# Patient Record
Sex: Male | Born: 1985 | Race: White | Hispanic: No | Marital: Married | Smoking: Current every day smoker
Health system: Southern US, Community
[De-identification: ages and names within clinical notes are randomized; demographics above are authoritative.]

## PROBLEM LIST (undated history)

## (undated) HISTORY — PX: HERNIA REPAIR: SHX51

---
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
## 2006-11-12 ENCOUNTER — Emergency Department (HOSPITAL_COMMUNITY): Admission: EM | Admit: 2006-11-12 | Discharge: 2006-11-12 | Payer: Self-pay | Admitting: Emergency Medicine

## 2007-10-02 ENCOUNTER — Emergency Department (HOSPITAL_COMMUNITY): Admission: EM | Admit: 2007-10-02 | Discharge: 2007-10-02 | Payer: Self-pay | Admitting: Emergency Medicine

## 2009-05-18 ENCOUNTER — Encounter: Payer: Self-pay | Admitting: Orthopedic Surgery

## 2009-05-18 ENCOUNTER — Emergency Department (HOSPITAL_COMMUNITY): Admission: EM | Admit: 2009-05-18 | Discharge: 2009-05-18 | Payer: Self-pay | Admitting: Emergency Medicine

## 2009-05-19 ENCOUNTER — Ambulatory Visit: Payer: Self-pay | Admitting: Orthopedic Surgery

## 2009-05-19 ENCOUNTER — Encounter (INDEPENDENT_AMBULATORY_CARE_PROVIDER_SITE_OTHER): Payer: Self-pay | Admitting: *Deleted

## 2009-05-19 DIAGNOSIS — S93409A Sprain of unspecified ligament of unspecified ankle, initial encounter: Secondary | ICD-10-CM | POA: Insufficient documentation

## 2009-05-19 DIAGNOSIS — S8263XA Displaced fracture of lateral malleolus of unspecified fibula, initial encounter for closed fracture: Secondary | ICD-10-CM

## 2009-05-26 ENCOUNTER — Ambulatory Visit: Payer: Self-pay | Admitting: Orthopedic Surgery

## 2009-06-16 ENCOUNTER — Ambulatory Visit: Payer: Self-pay | Admitting: Orthopedic Surgery

## 2009-10-04 ENCOUNTER — Emergency Department (HOSPITAL_COMMUNITY): Admission: EM | Admit: 2009-10-04 | Discharge: 2009-10-04 | Payer: Self-pay | Admitting: Emergency Medicine

## 2010-02-15 NOTE — Letter (Signed)
Summary: History form  History form   Imported By: Jacklynn Ganong 05/26/2009 15:36:58  _____________________________________________________________________  External Attachment:    Type:   Image     Comment:   External Document

## 2010-02-15 NOTE — Letter (Signed)
Summary: Out of Work  [facility]  [address]  [place], [place] [address]   Phone: [phone]  Fax: [phone]    June 16, 2009   Employee:  Derrick Mcdonald    To Whom It May Concern:   For Medical reasons, please excuse the above named employee from work for the following dates:  Start:   06-16-2009 He had an appointment here this morning, returning to work today  End:    If you need additional information, please feel free to contact our office.         Sincerely,    Dr. Terrance Mass.

## 2010-02-15 NOTE — Assessment & Plan Note (Signed)
Summary: 3 wk reck rt ankle after boot/bcbs/bsf   Visit Type:  Follow-up Referring Provider:  ap er Primary Provider:  na  CC:  recheck ankle.  History of Present Illness: 25 years old male injured her RIGHT ankle, May 2. He stepped off a truck. He is a clinical twisted, felt acute pain had acute swelling. Complains of 3/10 pain, which is dull, throbbing constant, associated with bruising, numbness, tingling, and swelling.  DOI MAY 2ND  Xrays [facility] right ankle on 05/18/09.x-rays show avulsion of the tip of the medial malleolus and lateral malleolus.  Meds: Norco 5 given from er, as needed.  Today is 3 week recheck rt ankle after cam walker.  Still has soreness, takes boot off to walk around house.'    Allergies: No Known Drug Allergies  Review of Systems       no giving out  Physical Exam  Skin:  intact without lesions or rashes Psych:  alert and cooperative; normal mood and affect; normal attention span and concentration   Foot/Ankle Exam  General:    Well-developed, well-nourished ,normal body habitus; no deformities, normal grooming.    Gait:    antalgic.  mild RIGHT ankle RIGHT foot RIGHT lower extremity antalgic gait  Skin:    Intact with no scars, lesions, rashes, cafe-au-lait spots or bruising.    Inspection:    swelling: RIGHT ankle mild  Palpation:    tenderness mild medial and lateral ankle joints near the malleolar  Vascular:    dorsalis pedis and posterior tibial pulses 2+ and symmetric, capillary refill < 2 seconds, normal hair pattern, no evidence of ischemia.   Sensory:    gross sensation intact bilaterally in lower extremities.    Motor:    weakness in eversion as well as inversion, dorsiflexion and plantar flexion are normal  Ankle Exam:    RIGHT ankle stability and inversion as well as anterior drawer   Impression & Recommendations:  Problem # 1:  CLOSED FRACTURE OF LATERAL MALLEOLUS (ICD-824.2) Assessment Improved  Orders: Est.  Patient Level III (04540)  Problem # 2:  ANKLE SPRAIN (ICD-845.00) Assessment: Improved  Patient Instructions: 1)  Wear ASO brace x 1 month  2)  f/u if needed

## 2010-02-15 NOTE — Assessment & Plan Note (Signed)
Summary: 1 WK RE-CK RT ANKLE?CAM WALKER/BCBS/CAF   Visit Type:  Follow-up Referring Provider:  ap er Primary Provider:  na  CC:  recheck rt ankle.  History of Present Illness: 25 years old male injured her RIGHT ankle, May 2. He stepped off a truck. He is a clinical twisted, felt acute pain had acute swelling. Complains of 3/10 pain, which is dull, throbbing constant, associated with bruising, numbness, tingling, and swelling.  Xrays [facility] right ankle on 05/18/09.x-rays show avulsion of the tip of the medial malleolus and lateral malleolus.  Meds: Norco 5 given from er, no antiinflammatory.  Is swelling has gone down his pain is improved we reviewed his x-rays he was here picture  He is placed in a short Cam Walker weight-bear as tolerated return in 3 weeks       Allergies: No Known Drug Allergies   Impression & Recommendations:  Problem # 1:  CLOSED FRACTURE OF LATERAL MALLEOLUS (ICD-824.2) Assessment Improved  Orders: Est. Patient Level II (04540)  Problem # 2:  ANKLE SPRAIN (ICD-845.00) Assessment: Improved  Orders: Est. Patient Level II (98119)  Patient Instructions: 1)  CAM WALKER WBAT  2)  RETURN IN 3 WEEKS

## 2010-02-15 NOTE — Assessment & Plan Note (Signed)
Summary: [facility] FOL/UP/RT ANKLE FX/XR [facility] 05/18/09/BCBS/CAF   Vital Signs:  Patient profile:   25 year old male Height:      72 inches Weight:      248 pounds Pulse rate:   72 / minute Resp:     16 per minute  Vitals Entered By: Fuller Canada MD (May 19, 2009 10:29 AM)  Visit Type:  new patient Referring Provider:  ap er Primary Provider:  na  CC:  right ankle pain.  History of Present Illness: 25 years old male injured her RIGHT ankle, May 2. He stepped off a truck. He is a clinical twisted, felt acute pain had acute swelling. Complains of 3/10 pain, which is dull, throbbing constant, associated with bruising, numbness, tingling, and swelling.   Xrays [facility] right ankle on 05/18/09.x-rays show avulsion of the tip of the medial malleolus and lateral malleolus.  Meds: Norco 5 given from er, does not take.      Allergies (verified): No Known Drug Allergies  Past History:  Past Medical History: na  Past Surgical History: na  Family History: FH of Cancer:  Family History of Diabetes Family History Coronary Heart Disease male < 46 Family History of Arthritis Hx, family, kidney disease NEC  Social History: Patient is married.  office work/ delivery driver smokes cigs alcohol use caffeine use daily  Review of Systems Constitutional:  Denies weight loss, weight gain, fever, chills, and fatigue. Cardiovascular:  Denies chest pain, palpitations, fainting, and murmurs. Respiratory:  Denies short of breath, wheezing, couch, tightness, pain on inspiration, and snoring . Gastrointestinal:  Complains of heartburn; denies nausea, vomiting, diarrhea, constipation, and blood in your stools. Genitourinary:  Denies frequency, urgency, difficulty urinating, painful urination, flank pain, and bleeding in urine. Neurologic:  Denies numbness, tingling, unsteady gait, dizziness, tremors, and seizure. Musculoskeletal:  Denies joint pain, swelling, instability, stiffness, redness, heat,  and muscle pain. Endocrine:  Denies excessive thirst, exessive urination, and heat or cold intolerance. Psychiatric:  Denies nervousness, depression, anxiety, and hallucinations. Skin:  Denies changes in the skin, poor healing, rash, itching, and redness. HEENT:  Denies blurred or double vision, eye pain, redness, and watering. Immunology:  Denies seasonal allergies, sinus problems, and allergic to bee stings. Hemoatologic:  Denies easy bleeding and brusing.  Physical Exam  Additional Exam:  Constitutional: vital signs see recorded values. General: normal development, nutrition, and grooming. No deformity. Body Habitus is large. CDV: Observation and palpation was normal  Lymph: palpation of the lymph nodes were normal Skin: inspection and palpation of the skin revealed no abnormalities  Neuro: coordination: normal              DTR's normal              Sensation was normal  Psyche: Alert and oriented x 3. Mood was normal.  Affect: normal  MSK: Gait: abnormal / nonweightbearing with crutches    clinical examination shows he has a significant amount of swelling mediolateral pain. Over the malleolar dorsal swelling of the foot, discoloration of the skin. Painful range of motion of 20 at the ankle joint. Pulsatile is normal.     Impression & Recommendations:  Problem # 1:  ANKLE SPRAIN (ICD-845.00)  Grade 2   Orders: New Patient Level III ([address])  Problem # 2:  CLOSED FRACTURE OF LATERAL MALLEOLUS (ICD-824.2)  avulsions medial and lateral   Orders: New Patient Level III ([address]454)  Patient Instructions: 1)  elevate the foot apply ice no weight bearing  2)  1 week re check ? go into CAM walker  3)  OOW x 1 week

## 2010-02-15 NOTE — Letter (Signed)
Summary: Out of Work  [facility]  [address]  [place], [place] [address]   Phone: [phone]  Fax: [phone]    May 19, 2009   Employee:  Derrick Mcdonald    To Whom It May Concern:   For Medical reasons, please excuse the above named employee from work for the following dates:  Start:   05/19/2009  End/Return to work:  05/26/09   If you need additional information, please feel free to contact our office.         Sincerely,    Terrance Mass, MD

## 2010-09-01 ENCOUNTER — Encounter: Payer: Self-pay | Admitting: *Deleted

## 2010-09-01 ENCOUNTER — Emergency Department (HOSPITAL_COMMUNITY)
Admission: EM | Admit: 2010-09-01 | Discharge: 2010-09-02 | Disposition: A | Discharge status: Home / Self Care | Payer: Self-pay | Attending: Emergency Medicine | Admitting: Emergency Medicine

## 2010-09-01 DIAGNOSIS — F17200 Nicotine dependence, unspecified, uncomplicated: Secondary | ICD-10-CM | POA: Insufficient documentation

## 2010-09-01 DIAGNOSIS — K047 Periapical abscess without sinus: Secondary | ICD-10-CM | POA: Insufficient documentation

## 2010-09-01 MED ORDER — PENICILLIN V POTASSIUM 250 MG PO TABS
500.0000 mg | ORAL_TABLET | Freq: Once | ORAL | Status: AC
  Administered 2010-09-01: 500 mg via ORAL
  Filled 2010-09-01: qty 2

## 2010-09-01 MED ORDER — PENICILLIN V POTASSIUM 500 MG PO TABS: 500.0000 mg | ORAL_TABLET | Freq: Four times a day (QID) | ORAL | Status: AC

## 2010-09-01 MED ORDER — OXYCODONE-ACETAMINOPHEN 5-325 MG PO TABS: 1.0000 | ORAL_TABLET | ORAL | Status: AC | PRN

## 2010-09-01 MED ORDER — OXYCODONE-ACETAMINOPHEN 5-325 MG PO TABS
1.0000 | ORAL_TABLET | Freq: Once | ORAL | Status: AC
  Administered 2010-09-01: 1 via ORAL
  Filled 2010-09-01: qty 1

## 2010-09-01 NOTE — ED Provider Notes (Signed)
History   Has seen a dentist in April and was put on antibiotics before he would pull his tooth in his right upper mouth. States he lost his insurance. Started getting painful again 2-3 weeks ago. No fever. Has swelling of his right face and gum.   CSN: [id number] Arrival date & time: 09/01/2010 10:08 PM  Chief Complaint  Patient presents with  . Dental Pain   Patient is a 25 y.o. male presenting with tooth pain. The history is provided by the patient.  Dental PainThe primary symptoms include mouth pain. Primary symptoms do not include fever, shortness of breath or sore throat. Episode onset: Pain started in April. The symptoms are worsening.  Additional symptoms include: gum swelling and gum tenderness. Additional symptoms do not include: trouble swallowing and pain with swallowing.    History reviewed. No pertinent past medical history.  History reviewed. No pertinent past surgical history.  History reviewed. No pertinent family history.  History  Substance Use Topics  . Smoking status: Current Everyday Smoker  . Smokeless tobacco: Not on file  . Alcohol Use: No      Review of Systems  Constitutional: Negative for fever.  HENT: Negative for sore throat and trouble swallowing.   Respiratory: Negative for shortness of breath.   All other systems reviewed and are negative.    Physical Exam  BP 163/83  Pulse 92  Temp(Src) 98.7 F (37.1 C) (Oral)  Resp 16  Ht 6\' 2"  (1.88 m)  Wt 250 lb (113.399 kg)  BMI 32.10 kg/m2  SpO2 99%  Physical Exam  Constitutional: He is oriented to person, place, and time. He appears well-developed and well-nourished.  HENT:  Head: Normocephalic and atraumatic.  Mouth/Throat: Oropharynx is clear and moist and mucous membranes are normal. Abnormal dentition. Dental abscesses present.         Has mild swelling of the right side of his face  Eyes: Conjunctivae and EOM are normal. Pupils are equal, round, and reactive to light.  Neck: Normal  range of motion. Neck supple.  Musculoskeletal: Normal range of motion.  Neurological: He is alert and oriented to person, place, and time.  Skin: Skin is warm and dry.  Psychiatric: He has a normal mood and affect.    ED Course  Procedures  MDM  During my exam I pressed on the swollen area and a large amount of purulent material was released.  Pt started on oral PEN VK and percocet.     Samuel Jester, MD 09/01/10 [phone]

## 2010-09-01 NOTE — ED Notes (Signed)
Dental pain  Since April

## 2011-12-10 ENCOUNTER — Encounter (HOSPITAL_COMMUNITY): Payer: Self-pay | Admitting: *Deleted

## 2011-12-10 ENCOUNTER — Emergency Department (HOSPITAL_COMMUNITY)
Admission: EM | Admit: 2011-12-10 | Discharge: 2011-12-10 | Disposition: A | Discharge status: Home / Self Care | Payer: Managed Care, Other (non HMO) | Attending: Emergency Medicine | Admitting: Emergency Medicine

## 2011-12-10 DIAGNOSIS — L237 Allergic contact dermatitis due to plants, except food: Secondary | ICD-10-CM

## 2011-12-10 DIAGNOSIS — T6591XA Toxic effect of unspecified substance, accidental (unintentional), initial encounter: Secondary | ICD-10-CM | POA: Insufficient documentation

## 2011-12-10 DIAGNOSIS — L299 Pruritus, unspecified: Secondary | ICD-10-CM | POA: Insufficient documentation

## 2011-12-10 DIAGNOSIS — F17200 Nicotine dependence, unspecified, uncomplicated: Secondary | ICD-10-CM | POA: Insufficient documentation

## 2011-12-10 DIAGNOSIS — R21 Rash and other nonspecific skin eruption: Secondary | ICD-10-CM | POA: Insufficient documentation

## 2011-12-10 DIAGNOSIS — L255 Unspecified contact dermatitis due to plants, except food: Secondary | ICD-10-CM | POA: Insufficient documentation

## 2011-12-10 DIAGNOSIS — Y92009 Unspecified place in unspecified non-institutional (private) residence as the place of occurrence of the external cause: Secondary | ICD-10-CM | POA: Insufficient documentation

## 2011-12-10 DIAGNOSIS — Y9389 Activity, other specified: Secondary | ICD-10-CM | POA: Insufficient documentation

## 2011-12-10 MED ORDER — PREDNISONE 10 MG PO TABS: ORAL_TABLET | ORAL | Status: DC

## 2011-12-10 MED ORDER — HYDROXYZINE HCL 50 MG PO TABS: ORAL_TABLET | ORAL | Status: DC

## 2011-12-10 MED ORDER — HYDROXYZINE HCL 25 MG PO TABS
50.0000 mg | ORAL_TABLET | Freq: Once | ORAL | Status: AC
  Administered 2011-12-10: 50 mg via ORAL
  Filled 2011-12-10: qty 2

## 2011-12-10 MED ORDER — PREDNISONE 50 MG PO TABS
60.0000 mg | ORAL_TABLET | Freq: Once | ORAL | Status: AC
  Administered 2011-12-10: 60 mg via ORAL
  Filled 2011-12-10: qty 1

## 2011-12-10 NOTE — ED Notes (Signed)
Poison oak to bil upper legs and bil arms x 1 wk.

## 2011-12-11 NOTE — ED Provider Notes (Signed)
Medical screening examination/treatment/procedure(s) were performed by non-physician practitioner and as supervising physician I was immediately available for consultation/collaboration.   [place] Carmack L Gorje Iyer, MD 12/11/11 2159 

## 2011-12-11 NOTE — ED Provider Notes (Signed)
History     CSN: [id number]  Arrival date & time 12/10/11  1024   First MD Initiated Contact with Patient 12/10/11 1116      Chief Complaint  Patient presents with  . 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  Since then,  He has developed scattered itchy, raised patches on his bilateral upper arms, forearms, hands on his upper thighs.  He has used plain calamine and caladryl lotion and oral benadryl without improvement in itching and spread.  There has been no drainage from the rash,  He has had no fevers or chills,  No facial swelling or shortness of breath.  The history is provided by the patient.    History reviewed. No pertinent past medical history.  Past Surgical History  Procedure Date  . Hernia repair     No family history on file.  History  Substance Use Topics  . Smoking status: Current Every Day Smoker    Types: Cigarettes  . Smokeless tobacco: Not on file  . Alcohol Use: No      Review of Systems  Constitutional: Negative for fever and chills.  HENT: Negative for facial swelling.   Respiratory: Negative for shortness of breath and wheezing.   Skin: Positive for rash.  Neurological: Negative for numbness.    Allergies  Review of patient's allergies indicates no known allergies.  Home Medications   Current Outpatient Rx  Name  Route  Sig  Dispense  Refill  . TUMS PO   Oral   Take 2 each by mouth at bedtime.           Marland Kitchen HYDROXYZINE HCL 50 MG PO TABS      Take one or 2 tablets every 6 hours as needed for itching   30 tablet   0   . IBUPROFEN 200 MG PO TABS   Oral   Take 400 mg by mouth daily as needed. For pain          . PREDNISONE 10 MG PO TABS      Take 6 tabs daily by mouth for 1 day,  Then 5 tabs  daily for 2 days,  4 tabs daily for 2 days,  3 tabs daily for 2 days,  2 tabs daily for 2 days,  Then 1 tab daily for 2 days.   36 tablet   0     BP 138/75  Pulse 90  Temp 98 F (36.7 C) (Oral)  Resp 16  Ht 6\' 2"  (1.88 m)  Wt 265 lb (120.203 kg)  BMI 34.02 kg/m2  SpO2 100%  Physical Exam  Constitutional: He appears well-developed and well-nourished. No distress.  HENT:  Head: Normocephalic.  Neck: Neck supple.  Cardiovascular: Normal rate.   Pulmonary/Chest: Effort normal. He has no wheezes.  Musculoskeletal: Normal range of motion. He exhibits no edema.  Skin: Rash noted. Rash is vesicular. There is erythema.    ED Course  Procedures (including critical care time)  Labs Reviewed - No data to display No results found.   1. Contact dermatitis due to poison oak       MDM  Pt prescribed prednisone taper, first dose given in ed.  Atarax also as trial in place of benadryl prn itching.  Recheck by pcp  if not improved over the next week.        Burgess Amor, [place] 12/11/11 2103

## 2012-04-03 ENCOUNTER — Emergency Department (HOSPITAL_COMMUNITY)
Admission: EM | Admit: 2012-04-03 | Discharge: 2012-04-03 | Disposition: A | Discharge status: Home / Self Care | Payer: Managed Care, Other (non HMO) | Attending: Emergency Medicine | Admitting: Emergency Medicine

## 2012-04-03 ENCOUNTER — Encounter (HOSPITAL_COMMUNITY): Payer: Self-pay | Admitting: *Deleted

## 2012-04-03 DIAGNOSIS — R112 Nausea with vomiting, unspecified: Secondary | ICD-10-CM | POA: Insufficient documentation

## 2012-04-03 DIAGNOSIS — F17200 Nicotine dependence, unspecified, uncomplicated: Secondary | ICD-10-CM | POA: Insufficient documentation

## 2012-04-03 DIAGNOSIS — R197 Diarrhea, unspecified: Secondary | ICD-10-CM | POA: Insufficient documentation

## 2012-04-03 DIAGNOSIS — R1013 Epigastric pain: Secondary | ICD-10-CM | POA: Insufficient documentation

## 2012-04-03 LAB — POCT I-STAT, CHEM 8
BUN: 6 mg/dL (ref 6–23)
Calcium, Ion: 1.21 mmol/L (ref 1.12–1.23)
Creatinine, Ser: 0.8 mg/dL (ref 0.50–1.35)
Glucose, Bld: 95 mg/dL (ref 70–99)
Hemoglobin: 16 g/dL (ref 13.0–17.0)
TCO2: 28 mmol/L (ref 0–100)

## 2012-04-03 MED ORDER — ONDANSETRON 8 MG PO TBDP
8.0000 mg | ORAL_TABLET | Freq: Once | ORAL | Status: AC
  Administered 2012-04-03: 8 mg via ORAL
  Filled 2012-04-03: qty 1

## 2012-04-03 MED ORDER — LOPERAMIDE HCL 2 MG PO CAPS
4.0000 mg | ORAL_CAPSULE | ORAL | Status: DC | PRN
  Administered 2012-04-03: 4 mg via ORAL
  Filled 2012-04-03: qty 2

## 2012-04-03 MED ORDER — PROMETHAZINE HCL 25 MG PO TABS: 25.0000 mg | ORAL_TABLET | Freq: Three times a day (TID) | ORAL | Status: DC | PRN

## 2012-04-03 MED ORDER — ONDANSETRON 8 MG PO TBDP: 8.0000 mg | ORAL_TABLET | Freq: Three times a day (TID) | ORAL | Status: DC | PRN

## 2012-04-03 NOTE — ED Notes (Signed)
Nauseated today, had vomiting and diarrhea yesterday

## 2012-04-03 NOTE — ED Provider Notes (Signed)
History  This chart was scribed for Ward Givens, MD by Bennett Scrape, ED Scribe. This patient was seen in room [facility] and the patient's care was started at 3:37 PM.  CSN: [id number]  Arrival date & time 04/03/12  1522   First MD Initiated Contact with Patient 04/03/12 1537      Chief Complaint  Patient presents with  . Nausea     The history is provided by the patient. No language interpreter was used.    Derrick Mcdonald is a 27 y.o. male who presents to the Emergency Department complaining of gradual onset, gradually worsening, constant nausea with associated several episodes of emesis, several episodes of diarrhea and epigastric abdominal pain that started yesterday after eating dinner. He reports that the last episode of emesis occurred this morning and last episode of diarrhea was 2 hours ago. He states that his wife was sick with similar symptoms at home  2 days before but got better after vomiting once. He reports drinking water and decreasing his food intake today with improvement. He denies any dizziness or lightheadedness,  numbness and fever as associated symptoms. He does feel alittle weak. He does not have a h/o chronic medical conditions and denies being on any daily medications. He is a "vapor" cigarette smoker, started 3 months ago, but denies alcohol use.  No currently PCP   History reviewed. No pertinent past medical history.  Past Surgical History  Procedure Laterality Date  . Hernia repair      No family history on file.  History  Substance Use Topics  . Smoking status: Current Every Day Smoker    Types: Cigarettes  . Smokeless tobacco: Not on file  . Alcohol Use: No  Pt works third shift Lives at home  Lives with spouse  Review of Systems  Constitutional: Negative for fever.  Gastrointestinal: Positive for nausea, vomiting, abdominal pain and diarrhea. Negative for blood in stool.  Neurological: Negative for dizziness, weakness, light-headedness  and numbness.  All other systems reviewed and are negative.    Allergies  Review of patient's allergies indicates no known allergies.  Home Medications  No current outpatient prescriptions on file.  Triage Vitals: BP 153/76  Pulse 79  Temp(Src) 97.9 F (36.6 C) (Oral)  Resp 18  Ht 6\' 2"  (1.88 m)  Wt 257 lb (116.574 kg)  BMI 32.98 kg/m2  SpO2 100%  Vital signs normal    Physical Exam  Nursing note and vitals reviewed. Constitutional: He is oriented to person, place, and time. He appears well-developed and well-nourished.  Non-toxic appearance. He does not appear ill. No distress.  HENT:  Head: Normocephalic and atraumatic.  Right Ear: External ear normal.  Left Ear: External ear normal.  Nose: Nose normal. No mucosal edema or rhinorrhea.  Mouth/Throat: Oropharynx is clear and moist and mucous membranes are normal. No dental abscesses or edematous.  Eyes: Conjunctivae and EOM are normal. Pupils are equal, round, and reactive to light.  Neck: Normal range of motion and full passive range of motion without pain. Neck supple.  Cardiovascular: Normal rate, regular rhythm and normal heart sounds.  Exam reveals no gallop and no friction rub.   No murmur heard. Pulmonary/Chest: Effort normal and breath sounds normal. No respiratory distress. He has no wheezes. He has no rhonchi. He has no rales. He exhibits no tenderness and no crepitus.  Abdominal: Soft. Normal appearance and bowel sounds are normal. He exhibits no distension. There is no tenderness. There is no rebound  and no guarding.  Musculoskeletal: Normal range of motion. He exhibits no edema and no tenderness.  Moves all extremities well.   Neurological: He is alert and oriented to person, place, and time. He has normal strength. No cranial nerve deficit.  Skin: Skin is warm, dry and intact. No rash noted. No erythema. No pallor.  Psychiatric: He has a normal mood and affect. His speech is normal and behavior is normal. His  mood appears not anxious.    ED Course  Procedures (including critical care time)  Medications  loperamide (IMODIUM) capsule 4 mg (4 mg Oral Given 04/03/12 1605)  ondansetron (ZOFRAN-ODT) disintegrating tablet 8 mg (8 mg Oral Given 04/03/12 1605)    DIAGNOSTIC STUDIES: Oxygen Saturation is 100% on room air, normal by my interpretation.    COORDINATION OF CARE: 3:53 PM-Discussed treatment plan which includes medications and I-stat with pt at bedside and pt agreed to plan.   4:53 PM-Pt rechecked and feels improved with medications listed above. Informed pt of lab work results. Pt asked me to evaluate a ganglion cyst on the left wrist. He denies pain. Advised pt to f/u with an orthopedist if he develops pain. Discussed discharge plan which includes staying hydrated with pt and pt agreed to plan. Will provide a work note.   Results for orders placed during the hospital encounter of 04/03/12  POCT I-STAT, CHEM 8      Result Value Range   Sodium 142  135 - 145 mEq/L   Potassium 4.3  3.5 - 5.1 mEq/L   Chloride 106  96 - 112 mEq/L   BUN 6  6 - 23 mg/dL   Creatinine, Ser 4.78  0.50 - 1.35 mg/dL   Glucose, Bld 95  70 - 99 mg/dL   Calcium, Ion 2.95  6.21 - 1.23 mmol/L   TCO2 28  0 - 100 mmol/L   Hemoglobin 16.0  13.0 - 17.0 g/dL   HCT 30.8  65.7 - 84.6 %   Laboratory interpretation all normal   1. Nausea vomiting and diarrhea      Discharge Medication List as of 04/03/2012  4:58 PM    START taking these medications   Details  ondansetron (ZOFRAN ODT) 8 MG disintegrating tablet Take 1 tablet (8 mg total) by mouth every 8 (eight) hours as needed for nausea., Starting 04/03/2012, Until Discontinued, Print    promethazine (PHENERGAN) 25 MG tablet Take 1 tablet (25 mg total) by mouth every 8 (eight) hours as needed for nausea (may make you sleepy)., Starting 04/03/2012, Until Discontinued, Print         Devoria Albe, MD, FACEP   MDM     I personally performed the services  described in this documentation, which was scribed in my presence. The recorded information has been reviewed and considered.  Devoria Albe, MD, Derrick Mcdonald       Ward Givens, MD 04/03/12 [id number]

## 2012-05-25 ENCOUNTER — Emergency Department (HOSPITAL_COMMUNITY)
Admission: EM | Admit: 2012-05-25 | Discharge: 2012-05-25 | Disposition: A | Discharge status: Home / Self Care | Payer: Managed Care, Other (non HMO) | Attending: Emergency Medicine | Admitting: Emergency Medicine

## 2012-05-25 ENCOUNTER — Encounter (HOSPITAL_COMMUNITY): Payer: Self-pay | Admitting: *Deleted

## 2012-05-25 DIAGNOSIS — Z79899 Other long term (current) drug therapy: Secondary | ICD-10-CM | POA: Insufficient documentation

## 2012-05-25 DIAGNOSIS — F17200 Nicotine dependence, unspecified, uncomplicated: Secondary | ICD-10-CM | POA: Insufficient documentation

## 2012-05-25 DIAGNOSIS — L509 Urticaria, unspecified: Secondary | ICD-10-CM | POA: Insufficient documentation

## 2012-05-25 MED ORDER — DIPHENHYDRAMINE HCL 25 MG PO CAPS
25.0000 mg | ORAL_CAPSULE | Freq: Once | ORAL | Status: AC
  Administered 2012-05-25: 25 mg via ORAL
  Filled 2012-05-25: qty 1

## 2012-05-25 MED ORDER — FAMOTIDINE 20 MG PO TABS: 20.0000 mg | ORAL_TABLET | Freq: Two times a day (BID) | ORAL | Status: DC

## 2012-05-25 MED ORDER — PREDNISONE 50 MG PO TABS
60.0000 mg | ORAL_TABLET | Freq: Once | ORAL | Status: AC
  Administered 2012-05-25: 60 mg via ORAL
  Filled 2012-05-25: qty 1

## 2012-05-25 MED ORDER — DIPHENHYDRAMINE HCL 25 MG PO TABS: 50.0000 mg | ORAL_TABLET | Freq: Four times a day (QID) | ORAL | Status: DC

## 2012-05-25 MED ORDER — PREDNISONE 20 MG PO TABS: 60.0000 mg | ORAL_TABLET | Freq: Every day | ORAL | Status: DC

## 2012-05-25 MED ORDER — FAMOTIDINE 20 MG PO TABS
20.0000 mg | ORAL_TABLET | Freq: Once | ORAL | Status: AC
  Administered 2012-05-25: 20 mg via ORAL
  Filled 2012-05-25: qty 1

## 2012-05-25 NOTE — ED Notes (Signed)
nad noted prior to dc. Dc instructions reviewed and explained. 3 scripts given to pt and voiced understanding. Ambulated out without difficulty.

## 2012-05-25 NOTE — Discharge Instructions (Signed)
Hives Hives are itchy, red, swollen areas of the skin. They can vary in size and location on your body. Hives can come and go for hours or several days (acute hives) or for several weeks (chronic hives). Hives do not spread from person to person (noncontagious). They may get worse with scratching, exercise, and emotional stress. CAUSES   Allergic reaction to food, additives, or drugs.  Infections, including the common cold.  Illness, such as vasculitis, lupus, or thyroid disease.  Exposure to sunlight, heat, or cold.  Exercise.  Stress.  Contact with chemicals. SYMPTOMS   Red or white swollen patches on the skin. The patches may change size, shape, and location quickly and repeatedly.  Itching.  Swelling of the hands, feet, and face. This may occur if hives develop deeper in the skin. DIAGNOSIS  Your caregiver can usually tell what is wrong by performing a physical exam. Skin or blood tests may also be done to determine the cause of your hives. In some cases, the cause cannot be determined. TREATMENT  Mild cases usually get better with medicines such as antihistamines. Severe cases may require an emergency epinephrine injection. If the cause of your hives is known, treatment includes avoiding that trigger.  HOME CARE INSTRUCTIONS   Avoid causes that trigger your hives.  Take antihistamines as directed by your caregiver to reduce the severity of your hives. Non-sedating or low-sedating antihistamines are usually recommended. Do not drive while taking an antihistamine.  Take any other medicines prescribed for itching as directed by your caregiver.  Wear loose-fitting clothing.  Keep all follow-up appointments as directed by your caregiver. SEEK MEDICAL CARE IF:   You have persistent or severe itching that is not relieved with medicine.  You have painful or swollen joints. SEEK IMMEDIATE MEDICAL CARE IF:   You have a fever.  Your tongue or lips are swollen.  You have  trouble breathing or swallowing.  You feel tightness in the throat or chest.  You have abdominal pain. These problems may be the first sign of a life-threatening allergic reaction. Call your local emergency services (911 in [place] MAKE SURE YOU:   Understand these instructions.  Will watch your condition.  Will get help right away if you are not doing well or get worse. Document Released: 01/02/2005 Document Revised: 07/04/2011 Document Reviewed: 03/28/2011 [facility] Patient Information 2013 [place], [place].   Take your next dose of prednisone tomorrow evening.  I recommend taking Benadryl and Pepcid for at least the next 2 days, after which you may stop these 2 medicines if your rash completely gone.  However, you need to take the entire course of prednisone until gone, for a total of 5 days including today's dose.  Return here for any problems such as listed above.

## 2012-05-25 NOTE — ED Notes (Signed)
First noticed rash 2 hours ago to body

## 2012-05-25 NOTE — ED Provider Notes (Signed)
History     CSN: [id number]  Arrival date & time 05/25/12  4098   First MD Initiated Contact with Patient 05/25/12 1840      Chief Complaint  Patient presents with  . Rash    (Consider location/radiation/quality/duration/timing/severity/associated sxs/prior treatment) Patient is a 27 y.o. male presenting with rash. The history is provided by the spouse and the patient.  Rash Location:  Ano-genital, torso and leg Torso rash location:  Abd RUQ and abd RLQ Ano-genital rash location:  Pelvis, L hip, R hip and groin Leg rash location:  L ankle and R ankle Quality: itchiness and redness   Quality: not blistering, not draining, not painful and not weeping   Severity:  Moderate Onset quality:  Sudden Duration:  2 hours Timing:  Constant Progression:  Waxing and waning Chronicity:  New Context: new detergent/soap   Context: not chemical exposure, not exposure to similar rash, not insect bite/sting, not medications and not nuts   Context comment:  He had used a new underarm deodorant but he switched to his old brand 3 days ago. Relieved by:  Antihistamines Worsened by:  Nothing tried Ineffective treatments:  None tried Associated symptoms: no fever, no hoarse voice, no nausea, no shortness of breath, no sore throat, no throat swelling, no tongue swelling and not wheezing     History reviewed. No pertinent past medical history.  Past Surgical History  Procedure Laterality Date  . Hernia repair      No family history on file.  History  Substance Use Topics  . Smoking status: Current Every Day Smoker    Types: Cigarettes  . Smokeless tobacco: Not on file  . Alcohol Use: No      Review of Systems  Constitutional: Negative for fever and chills.  HENT: Negative for sore throat, hoarse voice and facial swelling.   Respiratory: Negative for shortness of breath, wheezing and stridor.   Gastrointestinal: Negative for nausea.  Skin: Positive for rash.  Neurological: Negative  for numbness.    Allergies  Review of patient's allergies indicates no known allergies.  Home Medications   Current Outpatient Rx  Name  Route  Sig  Dispense  Refill  . diphenhydrAMINE (BENADRYL) 25 mg capsule   Oral   Take 50 mg by mouth every 6 (six) hours as needed for itching.         . diphenhydrAMINE (BENADRYL) 25 MG tablet   Oral   Take 2 tablets (50 mg total) by mouth every 6 (six) hours.   16 tablet   0   . famotidine (PEPCID) 20 MG tablet   Oral   Take 1 tablet (20 mg total) by mouth 2 (two) times daily.   6 tablet   0   . predniSONE (DELTASONE) 20 MG tablet   Oral   Take 3 tablets (60 mg total) by mouth daily.   12 tablet   0     BP 149/74  Pulse 87  Temp(Src) 98.9 F (37.2 C) (Oral)  Resp 20  SpO2 99%  Physical Exam  Constitutional: He appears well-developed and well-nourished. No distress.  HENT:  Head: Normocephalic.  Neck: Neck supple.  Cardiovascular: Normal rate.   Pulmonary/Chest: Effort normal. He has no wheezes.  Musculoskeletal: Normal range of motion. He exhibits no edema.  Skin: Rash noted. Rash is urticarial.    ED Course  Procedures (including critical care time)  Labs Reviewed - No data to display No results found.   1. Hives  Patient was given 25 mg of Benadryl as he had taken 25 mg prior to arrival, Pepcid 20 mg and prednisone 60 mg by mouth.  MDM  Classic hives of unclear etiology.  His only new exposure was 2 deodorant but hadn't used this new product in 3 days.  He was prescribed additional prednisone for a five-day pulse dose of 60 mg, also encouraged Pepcid twice a day along with Benadryl 4 times daily for the next several days.  Advised to return here for any worsened symptoms which were discussed.        Burgess Amor, PA-C 05/25/12 1931

## 2012-05-25 NOTE — ED Provider Notes (Signed)
Medical screening examination/treatment/procedure(s) were performed by non-physician practitioner and as supervising physician I was immediately available for consultation/collaboration.   Alexy Heldt J. Olimpia Tinch, MD 05/25/12 1932 

## 2012-07-30 ENCOUNTER — Encounter (HOSPITAL_COMMUNITY): Payer: Self-pay

## 2012-07-30 ENCOUNTER — Emergency Department (HOSPITAL_COMMUNITY)
Admission: EM | Admit: 2012-07-30 | Discharge: 2012-07-30 | Disposition: A | Discharge status: Home / Self Care | Payer: Managed Care, Other (non HMO) | Attending: Emergency Medicine | Admitting: Emergency Medicine

## 2012-07-30 ENCOUNTER — Emergency Department (HOSPITAL_COMMUNITY): Payer: Managed Care, Other (non HMO)

## 2012-07-30 DIAGNOSIS — M549 Dorsalgia, unspecified: Secondary | ICD-10-CM

## 2012-07-30 DIAGNOSIS — IMO0002 Reserved for concepts with insufficient information to code with codable children: Secondary | ICD-10-CM | POA: Insufficient documentation

## 2012-07-30 DIAGNOSIS — F17200 Nicotine dependence, unspecified, uncomplicated: Secondary | ICD-10-CM | POA: Insufficient documentation

## 2012-07-30 DIAGNOSIS — Y9389 Activity, other specified: Secondary | ICD-10-CM | POA: Insufficient documentation

## 2012-07-30 DIAGNOSIS — Z79899 Other long term (current) drug therapy: Secondary | ICD-10-CM | POA: Insufficient documentation

## 2012-07-30 DIAGNOSIS — X503XXA Overexertion from repetitive movements, initial encounter: Secondary | ICD-10-CM | POA: Insufficient documentation

## 2012-07-30 DIAGNOSIS — Y929 Unspecified place or not applicable: Secondary | ICD-10-CM | POA: Insufficient documentation

## 2012-07-30 MED ORDER — OXYCODONE-ACETAMINOPHEN 5-325 MG PO TABS
2.0000 | ORAL_TABLET | Freq: Once | ORAL | Status: AC
  Administered 2012-07-30: 2 via ORAL
  Filled 2012-07-30: qty 2

## 2012-07-30 MED ORDER — PREDNISONE 10 MG PO TABS: ORAL_TABLET | ORAL | Status: DC

## 2012-07-30 MED ORDER — OXYCODONE-ACETAMINOPHEN 5-325 MG PO TABS: 1.0000 | ORAL_TABLET | ORAL | Status: DC | PRN

## 2012-07-30 MED ORDER — CYCLOBENZAPRINE HCL 10 MG PO TABS: 10.0000 mg | ORAL_TABLET | Freq: Three times a day (TID) | ORAL | Status: DC | PRN

## 2012-07-30 NOTE — ED Notes (Signed)
Pt presents with lower back pain that began last night and has worsened today. Pt denies trauma or injury.

## 2012-07-30 NOTE — ED Notes (Signed)
Pt c/o pain in r lower back off and on for several months.  Denies injury.

## 2012-07-30 NOTE — ED Provider Notes (Signed)
History    CSN: [id number] Arrival date & time 07/30/12  1405  First MD Initiated Contact with Patient 07/30/12 1430     Chief Complaint  Patient presents with  . Back Pain   (Consider location/radiation/quality/duration/timing/severity/associated sxs/prior Treatment) Patient is a 27 y.o. male presenting with back pain. The history is provided by the patient and the spouse.  Back Pain Location:  Lumbar spine Quality:  Aching Radiates to:  R posterior upper leg and R thigh Pain severity:  Moderate Pain is:  Same all the time Onset quality:  Gradual Duration:  2 months Timing:  Constant Progression:  Worsening Chronicity:  Chronic Context: lifting heavy objects and twisting   Context: not recent illness and not recent injury   Relieved by:  Bed rest and being still Worsened by:  Ambulation, bending, standing, twisting and movement Ineffective treatments:  NSAIDs Associated symptoms: leg pain   Associated symptoms: no abdominal pain, no abdominal swelling, no bladder incontinence, no bowel incontinence, no chest pain, no dysuria, no fever, no headaches, no numbness, no paresthesias, no pelvic pain, no perianal numbness, no tingling and no weakness    History reviewed. No pertinent past medical history. Past Surgical History  Procedure Laterality Date  . Hernia repair     No family history on file. History  Substance Use Topics  . Smoking status: Current Every Day Smoker    Types: Cigarettes  . Smokeless tobacco: Not on file  . Alcohol Use: No    Review of Systems  Constitutional: Negative for fever.  Respiratory: Negative for shortness of breath.   Cardiovascular: Negative for chest pain.  Gastrointestinal: Negative for nausea, vomiting, abdominal pain, diarrhea, constipation and bowel incontinence.  Genitourinary: Negative for bladder incontinence, dysuria, hematuria, flank pain, decreased urine volume, difficulty urinating and pelvic pain.       No perineal  numbness or incontinence of urine or feces  Musculoskeletal: Positive for back pain. Negative for joint swelling.  Skin: Negative for rash.  Neurological: Negative for tingling, weakness, numbness, headaches and paresthesias.  All other systems reviewed and are negative.    Allergies  Review of patient's allergies indicates no known allergies.  Home Medications   Current Outpatient Rx  Name  Route  Sig  Dispense  Refill  . diphenhydrAMINE (BENADRYL) 25 mg capsule   Oral   Take 50 mg by mouth every 6 (six) hours as needed for itching.         . diphenhydrAMINE (BENADRYL) 25 MG tablet   Oral   Take 2 tablets (50 mg total) by mouth every 6 (six) hours.   16 tablet   0   . famotidine (PEPCID) 20 MG tablet   Oral   Take 1 tablet (20 mg total) by mouth 2 (two) times daily.   6 tablet   0   . predniSONE (DELTASONE) 20 MG tablet   Oral   Take 3 tablets (60 mg total) by mouth daily.   12 tablet   0    BP 150/94  Pulse 82  Temp(Src) 98.4 F (36.9 C)  Resp 18  Ht 6\' 2"  (1.88 m)  Wt 244 lb (110.678 kg)  BMI 31.31 kg/m2  SpO2 99% Physical Exam  Nursing note and vitals reviewed. Constitutional: He is oriented to person, place, and time. He appears well-developed and well-nourished. No distress.  HENT:  Head: Normocephalic and atraumatic.  Neck: Normal range of motion. Neck supple.  Cardiovascular: Normal rate, regular rhythm, normal heart sounds and intact  distal pulses.   No murmur heard. Pulmonary/Chest: Effort normal and breath sounds normal. No respiratory distress.  Musculoskeletal: He exhibits tenderness. He exhibits no edema.       Lumbar back: He exhibits tenderness and pain. He exhibits normal range of motion, no swelling, no deformity, no laceration and normal pulse.  ttp of the right lumbar paraspinal muscles and SI joiint.  No spinal tenderness.  DP pulses are brisk and symmetrical.  Distal sensation intact.  Hip Flexors/Extensors are intact  Neurological:  He is alert and oriented to person, place, and time. No cranial nerve deficit or sensory deficit. He exhibits normal muscle tone. Coordination and gait normal.  Reflex Scores:      Patellar reflexes are 2+ on the right side and 2+ on the left side.      Achilles reflexes are 2+ on the right side and 2+ on the left side. Skin: Skin is warm and dry.    ED Course  Procedures (including critical care time) Labs Reviewed - No data to display  Dg Lumbar Spine Complete  07/30/2012   *RADIOLOGY REPORT*  Clinical Data: Low back pain. Injured a few months ago.  LUMBAR SPINE - COMPLETE 4+ VIEW  Comparison: None.  Findings: Mild disc space narrowing L4-5 and L5-S1.  Slight retrolisthesis L5 on S1 of 1-2 mm is facet mediated.  No definite pars defects.  Lower lumbar facet arthropathy.   No worrisome osseous findings.  IMPRESSION: Mild degenerative change as described.   Original Report Authenticated By: Davonna Belling, M.D.     MDM    Patient reviewed the [facility] narcotics database. No recent narcotic prescriptions since January of this year.  Patient has ttp of the right  lumbar paraspinal muscles and SI joint.  No focal neuro deficits on exam.  Ambulates with a steady gait.   Doubt emergent neurological or infectious process.  Referral info given.  Pt agrees to rest, ice and close f/u .  Pain likely lumbar radiculopathy  Dylann Gallier L. Trisha Mangle, PA-C 08/01/12 1247

## 2012-08-03 NOTE — ED Provider Notes (Signed)
Medical screening examination/treatment/procedure(s) were performed by non-physician practitioner and as supervising physician I was immediately available for consultation/collaboration.   Joya Gaskins, MD 08/03/12 [phone]

## 2012-10-15 ENCOUNTER — Encounter: Payer: Self-pay | Admitting: Orthopedic Surgery

## 2012-10-15 ENCOUNTER — Ambulatory Visit (INDEPENDENT_AMBULATORY_CARE_PROVIDER_SITE_OTHER): Payer: Managed Care, Other (non HMO) | Admitting: Orthopedic Surgery

## 2012-10-15 VITALS — BP 127/86 | Ht 74.0 in | Wt 247.0 lb

## 2012-10-15 DIAGNOSIS — M48061 Spinal stenosis, lumbar region without neurogenic claudication: Secondary | ICD-10-CM

## 2012-10-15 MED ORDER — DICLOFENAC SODIUM 75 MG PO TBEC: 75.0000 mg | DELAYED_RELEASE_TABLET | Freq: Two times a day (BID) | ORAL | Status: DC

## 2012-10-15 NOTE — Progress Notes (Signed)
  Subjective:    Patient ID: Derrick Mcdonald, male    DOB: Dec 14, 1985, 27 y.o.   MRN: [id number]  Chief Complaint  Patient presents with  . Back Pain    Pain in lower back d/t injury. Referred by Bobbye Riggs    Back Pain   27 year old male who had an MRI in 2006 4 back pain and right leg pain presents with persistent back pain and occasional radiating pain in his right posterior thigh. Previous treatment includes Percocet, physical therapy for 6 weeks steroids IM and oral without any relief. It is also unclear whether or not he did have a trial of anti-inflammatories.  Review of systems    Review of Systems  Musculoskeletal: Positive for back pain.   joint pain stiffness muscle pain heartburn the other 12 systems were normal     Objective:   Physical Exam  Nursing note and vitals reviewed. Constitutional: He is oriented to person, place, and time. He appears well-developed and well-nourished. No distress.  HENT:  Head: Normocephalic.  Neck: Neck supple. No tracheal deviation present.  Cardiovascular: Normal rate.   Pulses:      Dorsalis pedis pulses are 2+ on the right side, and 2+ on the left side.       Posterior tibial pulses are 2+ on the right side, and 2+ on the left side.  Musculoskeletal: Normal range of motion.       Cervical back: Normal. He exhibits no tenderness.       Thoracic back: Normal. He exhibits no tenderness.       Lumbar back: He exhibits tenderness and bony tenderness. He exhibits no swelling, no edema, no deformity and no spasm.  Neurological: He is alert and oriented to person, place, and time. He has normal strength and normal reflexes. He displays normal reflexes. No sensory deficit. He exhibits normal muscle tone. Coordination normal.  Normal straight leg raises bilaterally. Normal muscle tone and strength bilaterally in both legs. Normal range of motion both hips  Skin: Skin is warm. No rash noted. He is diaphoretic. No erythema. No pallor.   Psychiatric: He has a normal mood and affect. His behavior is normal. Judgment and thought content normal.          Assessment & Plan:  Plain radiographs taken in July show degenerative disc disease  The patient has had a previous MRI back in 2006 which was done on the mobile unit. We do not have access to that. He says he was told he had 2 ruptured discs and spinal stenosis  Based on his failure of nonoperative treatment pain for greater than a year recommend repeat MRI in preparation for epidural steroids and/or referral to neurosurgery  I'll call him with these results is placed on anti-inflammatories to see if this will help his arthritis pain. We do not recommend narcotics.

## 2012-10-15 NOTE — Patient Instructions (Addendum)
MRI lumbar spine  Call patient with results  Start anti-inflammatories

## 2012-10-16 ENCOUNTER — Telehealth: Payer: Self-pay | Admitting: *Deleted

## 2012-10-16 NOTE — Telephone Encounter (Signed)
MRI lumbar spine CPT code 16109. No pre-cert required per AVR system. Patient scheduled for 10/18/12 at 7:45 am. Patient follow up with Dr. Romeo Apple 11/05/12 at 10:45 am

## 2012-10-18 ENCOUNTER — Ambulatory Visit (HOSPITAL_COMMUNITY): Payer: Managed Care, Other (non HMO)

## 2012-10-22 ENCOUNTER — Encounter (HOSPITAL_COMMUNITY): Payer: Self-pay

## 2012-10-22 ENCOUNTER — Other Ambulatory Visit: Payer: Self-pay | Admitting: Orthopedic Surgery

## 2012-10-22 ENCOUNTER — Ambulatory Visit (HOSPITAL_COMMUNITY)
Admission: RE | Admit: 2012-10-22 | Discharge: 2012-10-22 | Disposition: A | Discharge status: Home / Self Care | Payer: Managed Care, Other (non HMO) | Source: Ambulatory Visit | Attending: Orthopedic Surgery | Admitting: Orthopedic Surgery

## 2012-10-22 DIAGNOSIS — M5106 Intervertebral disc disorders with myelopathy, lumbar region: Secondary | ICD-10-CM | POA: Insufficient documentation

## 2012-10-22 DIAGNOSIS — M48061 Spinal stenosis, lumbar region without neurogenic claudication: Secondary | ICD-10-CM

## 2012-10-22 DIAGNOSIS — M545 Low back pain, unspecified: Secondary | ICD-10-CM | POA: Insufficient documentation

## 2012-11-05 ENCOUNTER — Ambulatory Visit: Payer: Managed Care, Other (non HMO) | Admitting: Orthopedic Surgery

## 2012-11-05 ENCOUNTER — Telehealth: Payer: Self-pay | Admitting: Orthopedic Surgery

## 2012-11-05 NOTE — Telephone Encounter (Signed)
Patient informed of MRI results, and is aware that a referral will be made to [facility].

## 2012-11-05 NOTE — Telephone Encounter (Signed)
Derrick Mcdonald cancelled his 11/05/12 appointment for MRI results due to car trouble.  He asked if you can call him with the results. His  Home # [phone] or cell # [phone]

## 2012-11-05 NOTE — Telephone Encounter (Signed)
He has spinal stenosis and we will refer him to neurosurgery

## 2012-11-06 ENCOUNTER — Telehealth: Payer: Self-pay | Admitting: *Deleted

## 2012-11-06 ENCOUNTER — Other Ambulatory Visit: Payer: Self-pay | Admitting: *Deleted

## 2012-11-06 DIAGNOSIS — M48061 Spinal stenosis, lumbar region without neurogenic claudication: Secondary | ICD-10-CM

## 2012-11-06 NOTE — Telephone Encounter (Signed)
Office notes and referral  faxed to [facility]. Awaiting appointment 

## 2012-12-16 NOTE — Telephone Encounter (Signed)
Received fax from [facility], and they have been unable to reach patient to schedule an appointment. I tried two different numbers and got the answering machine on both. I left a message for him to call the office.

## 2013-04-21 ENCOUNTER — Encounter (HOSPITAL_COMMUNITY): Payer: Self-pay | Admitting: Emergency Medicine

## 2013-04-21 ENCOUNTER — Emergency Department (HOSPITAL_COMMUNITY)
Admission: EM | Admit: 2013-04-21 | Discharge: 2013-04-21 | Disposition: A | Discharge status: Home / Self Care | Payer: Medicaid Other | Attending: Emergency Medicine | Admitting: Emergency Medicine

## 2013-04-21 DIAGNOSIS — K047 Periapical abscess without sinus: Secondary | ICD-10-CM

## 2013-04-21 DIAGNOSIS — F17200 Nicotine dependence, unspecified, uncomplicated: Secondary | ICD-10-CM | POA: Insufficient documentation

## 2013-04-21 DIAGNOSIS — Z792 Long term (current) use of antibiotics: Secondary | ICD-10-CM | POA: Insufficient documentation

## 2013-04-21 DIAGNOSIS — R51 Headache: Secondary | ICD-10-CM | POA: Insufficient documentation

## 2013-04-21 MED ORDER — OXYCODONE-ACETAMINOPHEN 5-325 MG PO TABS: 1.0000 | ORAL_TABLET | ORAL | Status: DC | PRN

## 2013-04-21 MED ORDER — CLINDAMYCIN HCL 150 MG PO CAPS: 150.0000 mg | ORAL_CAPSULE | Freq: Four times a day (QID) | ORAL | Status: DC

## 2013-04-21 NOTE — ED Provider Notes (Signed)
CSN: [id number]     Arrival date & time 04/21/13  1243 History  This chart was scribed for non-physician practitioner Burgess AmorJulie Amoree Newlon, PA-C working with Glynn OctaveStephen Rancour, MD by Dorothey Basemania Sutton, ED Scribe. This patient was seen in room APFT23/APFT23 and the patient's care was started at 3:01 PM.    Chief Complaint  Patient presents with  . Dental Pain   The history is provided by the patient. No language interpreter was used.   HPI Comments: Derrick Mcdonald is a 28 y.o. male who presents to the Emergency Department complaining of a constant pain to the right, lower dentition with associated right-sided facial swelling and a mild, diffuse headache onset 5 days ago that has been progressively worsening. He states that he followed up with a dentist and received an x-ray that indicated an infection in the area, so he was started on a course of amoxicillin, currently on day 5 but with worsened pain and swelling over the past several days.   Patient states that he has an appointment with the dentist to have the tooth extracted in 4 days, but could not wait to be seen because the pain became too severe. He reports taking Tylenol at home with a minimal amount of temporary relief. He denies fever, nausea, weakness, dizziness. Patient has no allergies to medications. Patient has no other pertinent medical history.   History reviewed. No pertinent past medical history. Past Surgical History  Procedure Laterality Date  . Hernia repair     History reviewed. No pertinent family history. History  Substance Use Topics  . Smoking status: Current Every Day Smoker -- 0.75 packs/day    Types: Cigarettes  . Smokeless tobacco: Not on file  . Alcohol Use: No    Review of Systems  Constitutional: Negative for fever.  HENT: Positive for dental problem and facial swelling. Negative for sore throat.   Respiratory: Negative for shortness of breath.   Gastrointestinal: Negative for nausea.  Musculoskeletal: Negative for neck  pain and neck stiffness.  Neurological: Positive for headaches. Negative for dizziness and weakness.      Allergies  Review of patient's allergies indicates no known allergies.  Home Medications   Current Outpatient Rx  Name  Route  Sig  Dispense  Refill  . acetaminophen (TYLENOL) 500 MG tablet   Oral   Take 1,500 mg by mouth 2 (two) times daily as needed for moderate pain.         Marland Kitchen. amoxicillin (AMOXIL) 500 MG capsule   Oral   Take 500 mg by mouth 3 (three) times daily.         . clindamycin (CLEOCIN) 150 MG capsule   Oral   Take 1 capsule (150 mg total) by mouth every 6 (six) hours.   28 capsule   0   . oxyCODONE-acetaminophen (PERCOCET/ROXICET) 5-325 MG per tablet   Oral   Take 1 tablet by mouth every 4 (four) hours as needed for severe pain.   20 tablet   0    Triage Vitals: BP 154/76  Pulse 91  Temp(Src) 98.7 F (37.1 C) (Oral)  Resp 14  Wt 255 lb (115.667 kg)  SpO2 99%  Physical Exam  Constitutional: He is oriented to person, place, and time. He appears well-developed and well-nourished. No distress.  HENT:  Head: Normocephalic and atraumatic.  Right Ear: Tympanic membrane and external ear normal.  Left Ear: Tympanic membrane and external ear normal.  Mouth/Throat: Oropharynx is clear and moist and mucous membranes are  normal. No oral lesions. No trismus in the jaw. Abnormal dentition. Dental abscesses present.  Fluctuant abscess along the right, inferior molars. No spontaneous drainage. Some decay of the 2nd, right, lower molar. Right submandibular adenopathy.   Eyes: Conjunctivae are normal.  Neck: Normal range of motion. Neck supple.  Cardiovascular: Normal rate and normal heart sounds.   Pulmonary/Chest: Effort normal.  Abdominal: He exhibits no distension.  Musculoskeletal: Normal range of motion.  Lymphadenopathy:       Head (right side): Submandibular adenopathy present.    He has no cervical adenopathy.  Neurological: He is alert and  oriented to person, place, and time.  Skin: Skin is warm and dry. No erythema.  Psychiatric: He has a normal mood and affect.    ED Course  Procedures (including critical care time)  DIAGNOSTIC STUDIES: Oxygen Saturation is 99% on room air, normal by my interpretation.    COORDINATION OF CARE: 3:07 PM- Will perform an incision and drainage of the abscess. Discussed treatment plan with patient at bedside and patient verbalized agreement.   3:54 PM- Attempted to drain the abscess. Will switch patient's antibiotic and discharge him with a short course of pain medication. Discussed treatment plan with patient at bedside and patient verbalized agreement.    INCISION AND DRAINAGE PROCEDURE NOTE: Patient identification was confirmed and verbal consent was obtained. This procedure was performed by Burgess Amor, PA-C at 3:45 PM. Site: right, lower gingiva  Sterile procedures observed Needle size: dental syringe Anesthetic used (type and amt): 0.5% marcaine, 0.6 mL Blade size: 11 Drainage: scant amount of purulent drainage with blood Complexity: simple Dental block, incision made over site, wound drained and explored loculations,  Pt tolerated procedure well without complications.  Instructions for care discussed verbally and pt provided with additional written instructions for homecare and f/u.   Labs Review Labs Reviewed - No data to display Imaging Review No results found.   EKG Interpretation None      MDM   Final diagnoses:  Dental abscess    Pt was switched to clindamycin from amoxil, since infection worsened while on amoxil.  He was prescribed oxycodone, cautioned regarding sedation and advised yogurt consumption.  Planned f/u with dentist in 4 days.  I feel there was still more retained pus in this infection pocket,  Patient deferred further drainage and flushing beyond the initial small gingival incision.    I personally performed the services described in this  documentation, which was scribed in my presence. The recorded information has been reviewed and is accurate.   Burgess Amor, PA-C 04/21/13 2127

## 2013-04-21 NOTE — Care Management Note (Signed)
ED/CM noted patient did not have health insurance and/or PCP listed in the computer.  Patient was given the [place] resource handout with information on the clinics, food pantries, and the handout for new health insurance sign-up.  Patient expressed appreciation for information received. 

## 2013-04-21 NOTE — Discharge Instructions (Signed)
Abscessed Tooth An abscessed tooth is an infection around your tooth. It may be caused by holes or damage to the tooth (cavity) or a dental disease. An abscessed tooth causes mild to very bad pain in and around the tooth. See your dentist right away if you have tooth or gum pain. HOME CARE  Take your medicine as told. Finish it even if you start to feel better.  Do not drive after taking pain medicine.  Rinse your mouth (gargle) often with salt water ( teaspoon salt in 8 ounces of warm water).  Do not apply heat to the outside of your face. GET HELP RIGHT AWAY IF:   You have a temperature by mouth above 102 F (38.9 C), not controlled by medicine.  You have chills and a very bad headache.  You have problems breathing or swallowing.  Your mouth will not open.  You develop puffiness (swelling) on the neck or around the eye.  Your pain is not helped by medicine.  Your pain is getting worse instead of better. MAKE SURE YOU:   Understand these instructions.  Will watch your condition.  Will get help right away if you are not doing well or get worse. Document Released: 06/21/2007 Document Revised: 03/27/2011 Document Reviewed: 04/12/2010 [facility] Patient Information 2014 [employer] [place].   Complete your entire course of antibiotics as prescribed.  You  may use the oxycodone for pain relief but do not drive within 4 hours of taking as this will make you drowsy.  Avoid applying heat or ice to this abscess area which can worsen your symptoms.  You may use warm salt water swish and spit treatment or half peroxide and water swish and spit after meals to keep this area clean as discussed.  Keep your appointment with your dentist as planned.

## 2013-04-21 NOTE — ED Notes (Signed)
Pt with dental pain to right bottom for 2 weeks but got worse Friday, states [facility]

## 2013-04-21 NOTE — ED Notes (Signed)
Pt arrives with c/o dental pain in right lower molar, states he has had pain since last Thursday and pain has gotten progressively worse. States he has an appointment to see dentist later this week but pain has become worse.

## 2013-04-22 NOTE — ED Provider Notes (Signed)
Medical screening examination/treatment/procedure(s) were performed by non-physician practitioner and as supervising physician I was immediately available for consultation/collaboration.   EKG Interpretation None       Ruhee Enck, MD 04/22/13 0924 

## 2013-06-26 ENCOUNTER — Emergency Department (HOSPITAL_COMMUNITY): Payer: Medicaid Other

## 2013-06-26 ENCOUNTER — Emergency Department (HOSPITAL_COMMUNITY)
Admission: EM | Admit: 2013-06-26 | Discharge: 2013-06-27 | Disposition: A | Discharge status: Home / Self Care | Payer: Medicaid Other | Attending: Emergency Medicine | Admitting: Emergency Medicine

## 2013-06-26 ENCOUNTER — Encounter (HOSPITAL_COMMUNITY): Payer: Self-pay | Admitting: Emergency Medicine

## 2013-06-26 DIAGNOSIS — IMO0002 Reserved for concepts with insufficient information to code with codable children: Secondary | ICD-10-CM | POA: Insufficient documentation

## 2013-06-26 DIAGNOSIS — Z79899 Other long term (current) drug therapy: Secondary | ICD-10-CM | POA: Insufficient documentation

## 2013-06-26 DIAGNOSIS — S60229A Contusion of unspecified hand, initial encounter: Secondary | ICD-10-CM | POA: Insufficient documentation

## 2013-06-26 DIAGNOSIS — S60222A Contusion of left hand, initial encounter: Secondary | ICD-10-CM

## 2013-06-26 DIAGNOSIS — Y9389 Activity, other specified: Secondary | ICD-10-CM | POA: Insufficient documentation

## 2013-06-26 DIAGNOSIS — Y929 Unspecified place or not applicable: Secondary | ICD-10-CM | POA: Insufficient documentation

## 2013-06-26 DIAGNOSIS — F17200 Nicotine dependence, unspecified, uncomplicated: Secondary | ICD-10-CM | POA: Insufficient documentation

## 2013-06-26 MED ORDER — HYDROCODONE-ACETAMINOPHEN 5-325 MG PO TABS
1.0000 | ORAL_TABLET | Freq: Once | ORAL | Status: AC
  Administered 2013-06-26: 1 via ORAL
  Filled 2013-06-26: qty 1

## 2013-06-26 NOTE — ED Notes (Signed)
Patient states he hit his left hand with a sledgehammer yesterday and c/o increased pain and now a knot has come up on hand.  Patient able to move all fingers.

## 2013-06-26 NOTE — ED Provider Notes (Signed)
CSN: [id number]     Arrival date & time 06/26/13  2304 History   First MD Initiated Contact with Patient 06/26/13 2315     Chief Complaint  Patient presents with  . Hand Injury     (Consider location/radiation/quality/duration/timing/severity/associated sxs/prior Treatment) Patient is a 28 y.o. male presenting with hand injury. The history is provided by the patient.  Hand Injury Location:  Hand Time since incident:  1 day Injury: yes   Mechanism of injury comment:  Direct blow Hand location:  L hand Pain details:    Quality:  Throbbing   Radiates to:  Does not radiate   Severity:  Moderate   Onset quality:  Sudden   Timing:  Constant   Progression:  Worsening Chronicity:  New Handedness:  Right-handed Dislocation: no   Foreign body present:  No foreign bodies Prior injury to area:  No Relieved by:  Nothing Worsened by:  Movement  Derrick Mcdonald is a 28 y.o. male who presents to the ED with left hand pain that started after he hit his hand with a sledgehammer yesterday. He was trying to hit a pin that went in atail gait and he slipped and hit his hand. He complains of increased pain and swelling today and has a knot that has come up on his hand. No breaks in the skin. History reviewed. No pertinent past medical history. Past Surgical History  Procedure Laterality Date  . Hernia repair     No family history on file. History  Substance Use Topics  . Smoking status: Current Every Day Smoker -- 0.75 packs/day    Types: Cigarettes  . Smokeless tobacco: Not on file  . Alcohol Use: No    Review of Systems Negative except as stated in HPI   Allergies  Review of patient's allergies indicates no known allergies.  Home Medications   Prior to Admission medications   Medication Sig Start Date End Date Taking? Authorizing Provider  acetaminophen (TYLENOL) 500 MG tablet Take 1,500 mg by mouth 2 (two) times daily as needed for moderate pain.    Historical Provider, MD    amoxicillin (AMOXIL) 500 MG capsule Take 500 mg by mouth 3 (three) times daily.    Historical Provider, MD  clindamycin (CLEOCIN) 150 MG capsule Take 1 capsule (150 mg total) by mouth every 6 (six) hours. 04/21/13   Burgess Amor, PA-C  oxyCODONE-acetaminophen (PERCOCET/ROXICET) 5-325 MG per tablet Take 1 tablet by mouth every 4 (four) hours as needed for severe pain. 04/21/13   Burgess Amor, PA-C  Physical Exam  Nursing note and vitals reviewed. Constitutional: He is oriented to person, place, and time. He appears well-developed and well-nourished.  Eyes: Conjunctivae and EOM are normal.  Neck: Neck supple.  Cardiovascular: Normal rate.   Pulmonary/Chest: Effort normal.  Abdominal: There is no tenderness.  Musculoskeletal:       Left hand: He exhibits tenderness and swelling. He exhibits normal range of motion, normal capillary refill, no deformity and no laceration. Normal sensation noted. Normal strength noted.       Hands: Swelling and ecchymosis to the dorsum of the left hand.   Neurological: He is alert and oriented to person, place, and time. No cranial nerve deficit.  Skin: Skin is warm and dry.  Psychiatric: He has a normal mood and affect. His behavior is normal.   BP 136/86  Pulse 87  Temp(Src) 98 F (36.7 C)  Resp 18  Ht 6\' 2"  (1.88 m)  Wt 260 lb (117.935  kg)  BMI 33.37 kg/m2  SpO2 99%  ED Course  Procedures Dg Hand Complete Left  06/26/2013   CLINICAL DATA:  Left hand pain and swelling of the index finger after smashed with a sledgehammer yesterday.  EXAM: LEFT HAND - COMPLETE 3+ VIEW  COMPARISON:  None.  FINDINGS: There is no evidence of fracture or dislocation. There is no evidence of arthropathy or other focal bone abnormality. Soft tissues are unremarkable.  IMPRESSION: Negative.   Electronically Signed   By: Burman NievesWilliam  Stevens M.D.   On: 06/26/2013 23:48    MDM  28 y.o. male with pain, swelling and ecchymosis to the left hand s/p injury. Ace wrap applied, ice, elevation and  pain management. He will return for worsening symptoms. I have reviewed this patient's vital signs, nurses notes, appropriate imaging and discussed findings and plan of care with the patient. He voices understanding and agrees with plan.      Medication List    TAKE these medications       HYDROcodone-acetaminophen 5-325 MG per tablet  Commonly known as:  NORCO/VICODIN  Take 1 tablet by mouth every 4 (four) hours as needed.     ibuprofen 800 MG tablet  Commonly known as:  ADVIL,MOTRIN  Take 1 tablet (800 mg total) by mouth 3 (three) times daily.      ASK your doctor about these medications       acetaminophen 500 MG tablet  Commonly known as:  TYLENOL  Take 1,500 mg by mouth 2 (two) times daily as needed for moderate pain.     amoxicillin 500 MG capsule  Commonly known as:  AMOXIL  Take 500 mg by mouth 3 (three) times daily.     clindamycin 150 MG capsule  Commonly known as:  CLEOCIN  Take 1 capsule (150 mg total) by mouth every 6 (six) hours.     oxyCODONE-acetaminophen 5-325 MG per tablet  Commonly known as:  PERCOCET/ROXICET  Take 1 tablet by mouth every 4 (four) hours as needed for severe pain.            [facility] Orlene OchM Hani Campusano, [place]NP 06/27/13 [id number]

## 2013-06-27 MED ORDER — HYDROCODONE-ACETAMINOPHEN 5-325 MG PO TABS: 1.0000 | ORAL_TABLET | ORAL | Status: DC | PRN

## 2013-06-27 MED ORDER — IBUPROFEN 800 MG PO TABS: 800.0000 mg | ORAL_TABLET | Freq: Three times a day (TID) | ORAL | Status: DC

## 2013-06-27 NOTE — ED Provider Notes (Signed)
Medical screening examination/treatment/procedure(s) were performed by non-physician practitioner and as supervising physician I was immediately available for consultation/collaboration.   EKG Interpretation None       Tamesha Ellerbrock, MD 06/27/13 0419 

## 2013-06-27 NOTE — Discharge Instructions (Signed)
Hand Contusion °A hand contusion is a deep bruise on your hand area. Contusions are the result of an injury that caused bleeding under the skin. The contusion may turn blue, purple, or yellow. Minor injuries will give you a painless contusion, but more severe contusions may stay painful and swollen for a few weeks. °CAUSES  °A contusion is usually caused by a blow, trauma, or direct force to an area of the body. °SYMPTOMS  °· Swelling and redness of the injured area. °· Discoloration of the injured area. °· Tenderness and soreness of the injured area. °· Pain. °DIAGNOSIS  °The diagnosis can be made by taking a history and performing a physical exam. An X-ray, CT scan, or MRI may be needed to determine if there were any associated injuries, such as broken bones (fractures). °TREATMENT  °Often, the best treatment for a hand contusion is resting, elevating, icing, and applying cold compresses to the injured area. Over-the-counter medicines may also be recommended for pain control. °HOME CARE INSTRUCTIONS  °· Put ice on the injured area. °· Put ice in a plastic bag. °· Place a towel between your skin and the bag. °· Leave the ice on for 15-20 minutes, 03-04 times a day. °· Only take over-the-counter or prescription medicines as directed by your caregiver. Your caregiver may recommend avoiding anti-inflammatory medicines (aspirin, ibuprofen, and naproxen) for 48 hours because these medicines may increase bruising. °· If told, use an elastic wrap as directed. This can help reduce swelling. You may remove the wrap for sleeping, showering, and bathing. If your fingers become numb, cold, or blue, take the wrap off and reapply it more loosely. °· Elevate your hand with pillows to reduce swelling. °· Avoid overusing your hand if it is painful. °SEEK IMMEDIATE MEDICAL CARE IF:  °· You have increased redness, swelling, or pain in your hand. °· Your swelling or pain is not relieved with medicines. °· You have loss of feeling in  your hand or are unable to move your fingers. °· Your hand turns cold or blue. °· You have pain when you move your fingers. °· Your hand becomes warm to the touch. °· Your contusion does not improve in 2 days. °MAKE SURE YOU:  °· Understand these instructions. °· Will watch your condition. °· Will get help right away if you are not doing well or get worse. °Document Released: 06/24/2001 Document Revised: 09/27/2011 Document Reviewed: 06/26/2011 °[employer]® Patient Information ©2014 [employer]. ° °

## 2013-12-16 ENCOUNTER — Emergency Department (HOSPITAL_COMMUNITY)
Admission: EM | Admit: 2013-12-16 | Discharge: 2013-12-16 | Disposition: A | Discharge status: Home / Self Care | Payer: Medicaid Other | Attending: Emergency Medicine | Admitting: Emergency Medicine

## 2013-12-16 ENCOUNTER — Encounter (HOSPITAL_COMMUNITY): Payer: Self-pay | Admitting: Emergency Medicine

## 2013-12-16 DIAGNOSIS — K0381 Cracked tooth: Secondary | ICD-10-CM | POA: Insufficient documentation

## 2013-12-16 DIAGNOSIS — Z79899 Other long term (current) drug therapy: Secondary | ICD-10-CM | POA: Diagnosis not present

## 2013-12-16 DIAGNOSIS — K088 Other specified disorders of teeth and supporting structures: Secondary | ICD-10-CM | POA: Insufficient documentation

## 2013-12-16 DIAGNOSIS — Z720 Tobacco use: Secondary | ICD-10-CM | POA: Diagnosis not present

## 2013-12-16 DIAGNOSIS — Z792 Long term (current) use of antibiotics: Secondary | ICD-10-CM | POA: Diagnosis not present

## 2013-12-16 DIAGNOSIS — K0889 Other specified disorders of teeth and supporting structures: Secondary | ICD-10-CM

## 2013-12-16 MED ORDER — PENICILLIN V POTASSIUM 250 MG PO TABS: 250.0000 mg | ORAL_TABLET | Freq: Four times a day (QID) | ORAL | Status: AC

## 2013-12-16 MED ORDER — TRAMADOL HCL 50 MG PO TABS: 50.0000 mg | ORAL_TABLET | Freq: Four times a day (QID) | ORAL | Status: DC | PRN

## 2013-12-16 NOTE — ED Notes (Signed)
Pt reports dental pain x2 days. Pt reports taking otc medication with no relief. Pt reports has a broken tooth but dentist does not have an available appointments. nad noted. Airway patent.

## 2013-12-16 NOTE — ED Provider Notes (Signed)
CSN: [id number]     Arrival date & time 12/16/13  1022 History   First MD Initiated Contact with Patient 12/16/13 1108     Chief Complaint  Patient presents with  . Dental Pain     (Consider location/radiation/quality/duration/timing/severity/associated sxs/prior Treatment) HPI Comments: Pt c/o right lower dental paint times 2 days. States that he thinks is looks a little swollen.no fever or trouble swallowing or breathing. States that he dentist isn't open today. Has tried otc medication without relief. He chipped a tooth almost 1 year ago  The history is provided by the patient. No language interpreter was used.    History reviewed. No pertinent past medical history. Past Surgical History  Procedure Laterality Date  . Hernia repair     History reviewed. No pertinent family history. History  Substance Use Topics  . Smoking status: Current Every Day Smoker -- 0.75 packs/day    Types: Cigarettes  . Smokeless tobacco: Not on file  . Alcohol Use: No    Review of Systems  Constitutional: Negative.   HENT: Positive for dental problem.   Respiratory: Negative.       Allergies  Review of patient's allergies indicates no known allergies.  Home Medications   Prior to Admission medications   Medication Sig Start Date End Date Taking? Authorizing Provider  acetaminophen (TYLENOL) 500 MG tablet Take 1,500 mg by mouth 2 (two) times daily as needed for moderate pain.    Historical Provider, MD  amoxicillin (AMOXIL) 500 MG capsule Take 500 mg by mouth 3 (three) times daily.    Historical Provider, MD  clindamycin (CLEOCIN) 150 MG capsule Take 1 capsule (150 mg total) by mouth every 6 (six) hours. 04/21/13   Burgess AmorJulie Idol, PA-C  HYDROcodone-acetaminophen (NORCO/VICODIN) 5-325 MG per tablet Take 1 tablet by mouth every 4 (four) hours as needed. 06/27/13   Hope Orlene OchM Neese, NP  ibuprofen (ADVIL,MOTRIN) 800 MG tablet Take 1 tablet (800 mg total) by mouth 3 (three) times daily. 06/27/13   Hope Orlene OchM  Neese, NP  oxyCODONE-acetaminophen (PERCOCET/ROXICET) 5-325 MG per tablet Take 1 tablet by mouth every 4 (four) hours as needed for severe pain. 04/21/13   Burgess AmorJulie Idol, PA-C  penicillin v potassium (VEETID) 250 MG tablet Take 1 tablet (250 mg total) by mouth 4 (four) times daily. 12/16/13 12/23/13  Teressa LowerVrinda Dellia Donnelly, NP  traMADol (ULTRAM) 50 MG tablet Take 1 tablet (50 mg total) by mouth every 6 (six) hours as needed. 12/16/13   Teressa LowerVrinda Amery Vandenbos, NP   BP 159/74 mmHg  Pulse 73  Temp(Src) 98 F (36.7 C) (Oral)  Resp 18  Ht 6\' 2"  (1.88 m)  Wt 260 lb (117.935 kg)  BMI 33.37 kg/m2  SpO2 100% Physical Exam  Constitutional: He appears well-developed and well-nourished.  HENT:  Right Ear: External ear normal.  Left Ear: External ear normal.  Mouth/Throat:    Cracked decayed tooth. No facial swelling noted  Cardiovascular: Normal rate and regular rhythm.   Pulmonary/Chest: Effort normal and breath sounds normal.  Nursing note and vitals reviewed.   ED Course  Procedures (including critical care time) Labs Review Labs Reviewed - No data to display  Imaging Review No results found.   EKG Interpretation None      MDM   Final diagnoses:  Toothache    Treated for possible infection with pcn and hydrocodone. Discussed importance of dental follow up    Teressa LowerVrinda Grover Robinson, NP 12/16/13 1127  Benny LennertJoseph L Zammit, MD 12/16/13 [phone]

## 2013-12-16 NOTE — Discharge Instructions (Signed)

## 2014-03-23 ENCOUNTER — Emergency Department (HOSPITAL_COMMUNITY)
Admission: EM | Admit: 2014-03-23 | Discharge: 2014-03-23 | Disposition: A | Discharge status: Home / Self Care | Payer: PRIVATE HEALTH INSURANCE | Attending: Emergency Medicine | Admitting: Emergency Medicine

## 2014-03-23 ENCOUNTER — Encounter (HOSPITAL_COMMUNITY): Payer: Self-pay | Admitting: Emergency Medicine

## 2014-03-23 DIAGNOSIS — Z79899 Other long term (current) drug therapy: Secondary | ICD-10-CM | POA: Insufficient documentation

## 2014-03-23 DIAGNOSIS — Z720 Tobacco use: Secondary | ICD-10-CM | POA: Diagnosis not present

## 2014-03-23 DIAGNOSIS — K047 Periapical abscess without sinus: Secondary | ICD-10-CM | POA: Insufficient documentation

## 2014-03-23 DIAGNOSIS — K088 Other specified disorders of teeth and supporting structures: Secondary | ICD-10-CM | POA: Diagnosis present

## 2014-03-23 DIAGNOSIS — K029 Dental caries, unspecified: Secondary | ICD-10-CM | POA: Diagnosis not present

## 2014-03-23 DIAGNOSIS — K0381 Cracked tooth: Secondary | ICD-10-CM | POA: Insufficient documentation

## 2014-03-23 DIAGNOSIS — Z792 Long term (current) use of antibiotics: Secondary | ICD-10-CM | POA: Insufficient documentation

## 2014-03-23 DIAGNOSIS — K006 Disturbances in tooth eruption: Secondary | ICD-10-CM | POA: Insufficient documentation

## 2014-03-23 MED ORDER — AMOXICILLIN 500 MG PO CAPS: 500.0000 mg | ORAL_CAPSULE | Freq: Three times a day (TID) | ORAL | Status: DC

## 2014-03-23 MED ORDER — TRAMADOL HCL 50 MG PO TABS: 50.0000 mg | ORAL_TABLET | Freq: Four times a day (QID) | ORAL | Status: DC | PRN

## 2014-03-23 NOTE — Discharge Instructions (Signed)
Dental Abscess A dental abscess is a collection of infected fluid (pus) from a bacterial infection in the inner part of the tooth (pulp). It usually occurs at the end of the tooth's root.  CAUSES   Severe tooth decay.  Trauma to the tooth that allows bacteria to enter into the pulp, such as a broken or chipped tooth. SYMPTOMS   Severe pain in and around the infected tooth.  Swelling and redness around the abscessed tooth or in the mouth or face.  Tenderness.  Pus drainage.  Bad breath.  Bitter taste in the mouth.  Difficulty swallowing.  Difficulty opening the mouth.  Nausea.  Vomiting.  Chills.  Swollen neck glands. DIAGNOSIS   A medical and dental history will be taken.  An examination will be performed by tapping on the abscessed tooth.  X-rays may be taken of the tooth to identify the abscess. TREATMENT The goal of treatment is to eliminate the infection. You may be prescribed antibiotic medicine to stop the infection from spreading. A root canal may be performed to save the tooth. If the tooth cannot be saved, it may be pulled (extracted) and the abscess may be drained.  HOME CARE INSTRUCTIONS  Only take over-the-counter or prescription medicines for pain, fever, or discomfort as directed by your caregiver.  Rinse your mouth (gargle) often with salt water ( tsp salt in 8 oz [250 ml] of warm water) to relieve pain or swelling.  Do not drive after taking pain medicine (narcotics).  Do not apply heat to the outside of your face.  Return to your dentist for further treatment as directed. SEEK MEDICAL CARE IF:  Your pain is not helped by medicine.  Your pain is getting worse instead of better. SEEK IMMEDIATE MEDICAL CARE IF:  You have a fever or persistent symptoms for more than 2-3 days.  You have a fever and your symptoms suddenly get worse.  You have chills or a very bad headache.  You have problems breathing or swallowing.  You have trouble  opening your mouth.  You have swelling in the neck or around the eye. Document Released: 01/02/2005 Document Revised: 09/27/2011 Document Reviewed: 04/12/2010 [facility] Patient Information 2015 [place], [place]. This information is not intended to replace advice given to you by your health care provider. Make sure you discuss any questions you have with your health care provider.   Complete your entire course of antibiotics as prescribed.  You  may use the tramadol for pain relief but do not drive within 4 hours of taking as this will make you drowsy.  Avoid applying heat or ice to this abscess area which can worsen your symptoms.  You may use warm salt water swish and spit treatment or half peroxide and water swish and spit after meals to keep this area clean.

## 2014-03-23 NOTE — ED Provider Notes (Signed)
CSN: [id number]     Arrival date & time 03/23/14  4098 History   First MD Initiated Contact with Patient 03/23/14 [phone]     Chief Complaint  Patient presents with  . Dental Pain     (Consider location/radiation/quality/duration/timing/severity/associated sxs/prior Treatment) Patient is a 29 y.o. male presenting with tooth pain. The history is provided by the patient.  Dental Pain Location:  Upper Upper teeth location:  12/LU 1st bicuspid and 11/LU cuspid Quality:  Throbbing Severity:  Moderate Onset quality:  Gradual Duration:  3 days Timing:  Constant Progression:  Unchanged Chronicity:  Recurrent Context: abscess, dental caries and dental fracture   Prior workup: is scheduled to see new dentist on March 25.  Advised to get abx prior to appt in anticipation of probable extractions at that visit. Relieved by:  NSAIDs (motrin helps some) Worsened by:  Nothing tried Ineffective treatments:  None tried Associated symptoms: gum swelling   Associated symptoms: no difficulty swallowing, no facial pain, no facial swelling, no fever, no neck pain, no neck swelling, no oral lesions and no trismus     History reviewed. No pertinent past medical history. Past Surgical History  Procedure Laterality Date  . Hernia repair     History reviewed. No pertinent family history. History  Substance Use Topics  . Smoking status: Current Every Day Smoker -- 0.75 packs/day for 15 years    Types: Cigarettes  . Smokeless tobacco: Not on file  . Alcohol Use: No    Review of Systems  Constitutional: Negative for fever.  HENT: Positive for dental problem. Negative for facial swelling, mouth sores and sore throat.   Respiratory: Negative for shortness of breath.   Musculoskeletal: Negative for neck pain and neck stiffness.      Allergies  Review of patient's allergies indicates no known allergies.  Home Medications   Prior to Admission medications   Medication Sig Start Date End Date  Taking? Authorizing Provider  acetaminophen (TYLENOL) 500 MG tablet Take 1,500 mg by mouth 2 (two) times daily as needed for moderate pain.    Historical Provider, MD  amoxicillin (AMOXIL) 500 MG capsule Take 1 capsule (500 mg total) by mouth 3 (three) times daily. 03/23/14   Burgess Amor, PA-C  clindamycin (CLEOCIN) 150 MG capsule Take 1 capsule (150 mg total) by mouth every 6 (six) hours. 04/21/13   Burgess Amor, PA-C  HYDROcodone-acetaminophen (NORCO/VICODIN) 5-325 MG per tablet Take 1 tablet by mouth every 4 (four) hours as needed. 06/27/13   Hope Orlene Och, NP  ibuprofen (ADVIL,MOTRIN) 800 MG tablet Take 1 tablet (800 mg total) by mouth 3 (three) times daily. 06/27/13   Hope Orlene Och, NP  oxyCODONE-acetaminophen (PERCOCET/ROXICET) 5-325 MG per tablet Take 1 tablet by mouth every 4 (four) hours as needed for severe pain. 04/21/13   Burgess Amor, PA-C  traMADol (ULTRAM) 50 MG tablet Take 1 tablet (50 mg total) by mouth every 6 (six) hours as needed. 03/23/14   Burgess Amor, PA-C   BP 150/88 mmHg  Pulse 101  Temp(Src) 98.2 F (36.8 C)  Resp 16  Ht  (1.88 m)  Wt 261 lb (118.389 kg)  BMI 33.50 kg/m2  SpO2 100% Physical Exam  Constitutional: He is oriented to person, place, and time. He appears well-developed and well-nourished. No distress.  HENT:  Head: Normocephalic and atraumatic.  Right Ear: Tympanic membrane and external ear normal.  Left Ear: Tympanic membrane and external ear normal.  Mouth/Throat: Oropharynx is clear and moist and mucous  membranes are normal. No oral lesions. No trismus in the jaw. Abnormal dentition. Dental abscesses and dental caries present.    Generalized dental decay, several old extractions.  Eyes: Conjunctivae are normal.  Neck: Normal range of motion. Neck supple.  Cardiovascular: Normal rate and normal heart sounds.   Pulmonary/Chest: Effort normal.  Abdominal: He exhibits no distension.  Musculoskeletal: Normal range of motion.  Lymphadenopathy:    He has no  cervical adenopathy.  Neurological: He is alert and oriented to person, place, and time.  Skin: Skin is warm and dry. No erythema.  Psychiatric: He has a normal mood and affect.    ED Course  Procedures (including critical care time) Labs Review Labs Reviewed - No data to display  Imaging Review No results found.   EKG Interpretation None      MDM   Final diagnoses:  Dental abscess   Amoxil, tramadol.  F/u with new dentist as planned.  The patient appears reasonably screened and/or stabilized for discharge and I doubt any other medical condition or other [facility] requiring further screening, evaluation, or treatment in the ED at this time prior to discharge.    Burgess AmorJulie Friend Dorfman, PA-C 03/23/14 [id number]  Benjiman CoreNathan Pickering, MD 03/23/14 [phone]

## 2014-03-23 NOTE — ED Notes (Signed)
Oral pain began Friday. Pt reports swelling and pain around specific tooth in upper jaw, front area. Pt reports that he has a dentist appointment but they are not able to see him until the  25th.

## 2014-05-31 ENCOUNTER — Emergency Department (HOSPITAL_COMMUNITY)
Admission: EM | Admit: 2014-05-31 | Discharge: 2014-05-31 | Disposition: A | Discharge status: Home / Self Care | Payer: Managed Care, Other (non HMO) | Attending: Emergency Medicine | Admitting: Emergency Medicine

## 2014-05-31 ENCOUNTER — Encounter (HOSPITAL_COMMUNITY): Payer: Self-pay | Admitting: Emergency Medicine

## 2014-05-31 DIAGNOSIS — K029 Dental caries, unspecified: Secondary | ICD-10-CM | POA: Insufficient documentation

## 2014-05-31 DIAGNOSIS — Z791 Long term (current) use of non-steroidal anti-inflammatories (NSAID): Secondary | ICD-10-CM | POA: Diagnosis not present

## 2014-05-31 DIAGNOSIS — Z792 Long term (current) use of antibiotics: Secondary | ICD-10-CM | POA: Insufficient documentation

## 2014-05-31 DIAGNOSIS — K088 Other specified disorders of teeth and supporting structures: Secondary | ICD-10-CM | POA: Insufficient documentation

## 2014-05-31 DIAGNOSIS — Z720 Tobacco use: Secondary | ICD-10-CM | POA: Insufficient documentation

## 2014-05-31 DIAGNOSIS — K0889 Other specified disorders of teeth and supporting structures: Secondary | ICD-10-CM

## 2014-05-31 MED ORDER — PENICILLIN V POTASSIUM 500 MG PO TABS: 500.0000 mg | ORAL_TABLET | Freq: Four times a day (QID) | ORAL | Status: AC

## 2014-05-31 NOTE — Discharge Instructions (Signed)
Dental Pain °Toothache is pain in or around a tooth. It may get worse with chewing or with cold or heat.  °HOME CARE °· Your dentist may use a numbing medicine during treatment. If so, you may need to avoid eating until the medicine wears off. Ask your dentist about this. °· Only take medicine as told by your dentist or doctor. °· Avoid chewing food near the painful tooth until after all treatment is done. Ask your dentist about this. °GET HELP RIGHT AWAY IF:  °· The problem gets worse or new problems appear. °· You have a fever. °· There is redness and puffiness (swelling) of the face, jaw, or neck. °· You cannot open your mouth. °· There is pain in the jaw. °· There is very bad pain that is not helped by medicine. °MAKE SURE YOU:  °· Understand these instructions. °· Will watch your condition. °· Will get help right away if you are not doing well or get worse. °Document Released: 06/21/2007 Document Revised: 03/27/2011 Document Reviewed: 06/21/2007 °ExitCare® Patient Information ©2015 [employer]. This information is not intended to replace advice given to you by your health care provider. Make sure you discuss any questions you have with your health care provider. ° ° ° °Emergency Department Resource Guide °1) Find a Doctor and Pay Out of Pocket °Although you won't have to find out who is covered by your insurance plan, it is a good idea to ask around and get recommendations. You will then need to call the office and see if the doctor you have chosen will accept you as a new patient and what types of options they offer for patients who are self-pay. Some doctors offer discounts or will set up payment plans for their patients who do not have insurance, but you will need to ask so you aren't surprised when you get to your appointment. ° °2) Contact Your Local Health Department °Not all health departments have doctors that can see patients for sick visits, but many do, so it is worth a call to see if yours does.  If you don't know where your local health department is, you can check in your phone book. The CDC also has a tool to help you locate your state's health department, and many state websites also have listings of all of their local health departments. ° °3) Find a Walk-in Clinic °If your illness is not likely to be very severe or complicated, you may want to try a walk in clinic. These are popping up all over the country in pharmacies, drugstores, and shopping centers. They're usually staffed by nurse practitioners or physician assistants that have been trained to treat common illnesses and complaints. They're usually fairly quick and inexpensive. However, if you have serious medical issues or chronic medical problems, these are probably not your best option. ° °No Primary Care Doctor: °- Call [facility] at  832-8000 - they can help you locate a primary care doctor that  accepts your insurance, provides certain services, etc. °- Physician Referral Service- 1-800-533-3463 ° °Chronic Pain Problems: °Organization         Address  Phone   Notes  °[facility]  (336) 297-2271 Patients need to be referred by their primary care doctor.  ° °Medication Assistance: °Organization         Address  Phone   Notes  °[place] [facility] [address] °[place], [place] [address] (336) 641-8030 --Must be a resident   of [place] °-- Must have NO insurance coverage whatsoever (no Medicaid/ Medicare, etc.) °-- The pt. MUST have a primary care doctor that directs their care regularly and follows them in the community °  °[facility]  (866) 331-1348   °[employer]  (888) 892-1162   ° °Agencies that provide inexpensive medical care: °Organization         Address  Phone   Notes  °[facility]  (336) 832-8035   °[facility]    (336) 832-7272   °[facility] [address] °[place], [place] [address] (336) 832-4777   °[facility] [address], °[place] (336) 271-4999   °[facility]    (336) 373-0678   °[facility]    (336) 272-1050   °[facility] ° [address], [place] Phone:  (336) 832-4444, Fax:  (336) 832-4440 Hours of Operation:  9 am - 6 pm, M-F.  Also accepts Medicaid/Medicare and self-pay.  °[facility] ° [address], [place] Phone: (336) 832-3150, Fax: (336) 832-3151. Hours of Operation:  8:30 am - 5:30 pm, M-F.  Also accepts Medicaid and self-pay.  °[facility] [place] [address], [place] Phone: (336) 878-6027   °[facility] [address], [place], [place] (336)723-1848, Ext. 123 Mondays & Thursdays: 7-9 AM.  First 15 patients are seen on a first come, first serve basis. °  ° °Medicaid-accepting [place] Providers: ° °Organization         Address  Phone   Notes  °[facility] [address], [place] (336) 641-2100 Also accepts self-pay patients.  °[facility] [address], [place] ° (336) 856-9996   °[facility] [address], [place] (336) 288-8857   °[facility] [address] [place] [address], [place] (336) 299-7000   °Veita Bland [address], [place]  ° (336) 373-1557 Only accepts [place] Access Medicaid patients after they have their name applied to their card.  ° °Self-Pay (no insurance) in [place]: ° °Organization         Address  Phone   Notes  °Sickle Cell Patients, [facility] [address], [place] (336) 832-1970   °[facility] [address], [place] (336) 832-4400   ° [facility] ° [address], [place] (336) 992-4800   °[facility]/Dr. Osei-Bonsu ° [address] [place] [address], [place] or [address], [place] (336) 841-8500  Phone number for both [place] and [place] locations is the same.  °[facility] [address], [place] (336) 299-0000   °[facility] [address] [place] [address], [place] or [address] (336) 852-7530 °(336) 878-2260   °[facility] [address],  (336) 350-1642, phone; (336) 294-5005, fax Sees patients 1st and 3rd Saturday of every month.  Must not qualify for public or private insurance (i.e. Medicaid, Medicare, [place] Health Choice, Veterans' Benefits) • Household income should be no more than 200% of the poverty level •The clinic cannot treat you if you are pregnant or think you are pregnant • Sexually transmitted diseases are not treated at the clinic.  ° ° °Dental Care: °Organization         Address    Phone  Notes  °[facility] [facility] [address], [place] (336) 641-6152 Accepts children up to age 21 who are enrolled in Medicaid or [place] Health Choice; pregnant women with a Medicaid card; and children who have applied for Medicaid or [place] Health Choice, but were declined, whose parents can pay a reduced fee at time of service.  °[facility] [place]  [address], [place] (336) 641-7733 Accepts children up to age 21 who are enrolled in Medicaid or [place] Health Choice; pregnant women with a Medicaid card; and children who have applied for Medicaid or [place] Health Choice, but were declined, whose parents can pay a reduced fee at time of service.  °[facility] ° [address], [place] (336) 641-4533 Patients are seen by appointment only. Walk-ins are not accepted. [facility] will see patients 18 years of age and older. °Monday - Tuesday (8am-5pm) °Most Wednesdays (8:30-5pm) °$30 per visit, cash only  °[facility] ° [address], [place] (336) 641-4533 Patients are seen by appointment  only. Walk-ins are not accepted. [facility] will see patients 18 years of age and older. °One Wednesday Evening (Monthly: Volunteer Based).  $30 per visit, cash only  °[facility]  (919) 537-3737 for adults; Children under age 4, call Graduate Pediatric Dentistry at (919) [phone]. Children aged 4-14, please call (919) [phone] to request a pediatric application. ° Dental services are provided in all areas of dental care including fillings, crowns and bridges, complete and partial dentures, implants, gum treatment, root canals, and extractions. Preventive care is also provided. Treatment is provided to both adults and children. °Patients are selected via a lottery and there is often a waiting list. °  °[facility] [address], °[place] ° (336) 763-8833 [email or web address] °  °[facility] [address], [place], [place] (336)723-1848, Ext. 123 Second and Fourth Thursday of each month, opens at 6:30 AM; Clinic ends at 9 AM.  Patients are seen on a first-come first-served basis, and a limited number are seen during each clinic.  ° °[facility] ° [address], [place], [place] (336) 723-7904   Eligibility Requirements °You must have lived in [place], [place], or [place] counties for at least the last three months. °  You cannot be eligible for state or federal sponsored healthcare insurance, including Veterans Administration, Medicaid, or Medicare. °  You generally cannot be eligible for healthcare insurance through your employer.  °  How to apply: °Eligibility screenings are held every Tuesday and Wednesday afternoon from 1:00 pm until 4:00 pm. You do not need an appointment for the interview!  °[facility] [address], [place], [place] 336-631-2330   °[facility]  336-342-8273   °[facility]  336-703-3100   ° [facility]  336-570-6415   ° °Behavioral Health  Resources in the Community: °Intensive Outpatient Programs °Organization         Address  Phone  Notes  °[facility] [address], [place], [place] 336-878-6098   °[facility] [address], [place], [place] 336-832-9800   °[facility] [address], [place], [place] ° 336-882-2125   °[facility] [address],  °[place],  1-800-853-5163 or 336-641-4981   °Substance Abuse Resources °Organization           Address  Phone  Notes  °[facility]  336-882-2125   °[facility]  336-784-9470   °[facility]  336-285-9073   °[facility]  336-845-3988   °Residential & Outpatient Substance Abuse Program  1-800-659-3381   °Psychological Services °Organization         Address  Phone  Notes  °[facility]  336- 832-9600   °[facility]  336- 378-7881   °[facility] [address], [place] 1-800-853-5163 or 336-641-4981   ° °Mobile Crisis Teams °Organization         Address  Phone  Notes  °[facility], Mobile Crisis Care Unit  1-877-626-1772   °Assertive °[facility] ° [address] [place], [place] 336-834-9664   °Sharon DeEsch [address] °[place] 336-554-5454   ° °Self-Help/Support Groups °Organization         Address  Phone             Notes  °[facility] - variety of support groups  336- 373-1402 Call for more information  °Narcotics Anonymous (NA), [facility] [address], °[place]  2 meetings at this location  ° °Residential Treatment Programs °Organization         Address  Phone  Notes  °[facility] [address],    °[place]  1-866-801-8205   °[facility] ° [address], [place], [place] 704-293-8524   °[facility] [address], [place] 336-845-3988 Admissions: 8am-3pm M-F  °[facility] [address]    °[place], [place] 336-841-1104   °[facility] [address], [place], [place] 336-379-7146   °[facility] [address]  °[place], [place] 336-285-9073   °[facility] Intensive Outpatient [address], [place], [place] 336-852-3033   °[facility] [address]  °[place], [place] 1-877-615-2722 or 336-784-9470   °[facility] [address] [place], [place] 336-227-7417 Accepts Medicaid  °[facility] [address]  ° [place] 1-800-659-3381 Substance Abuse/Addiction Treatment  ° °[place] Behavioral Health Resources °Organization         Address  Phone  Notes  °[facility]  (888) 581-9988   °Julie Brannon, PhD [address] [place], [place]   (336) 349-5553 or (336) 951-0000   °[facility]   [address] °[place], [place] (336) 349-4454   °[facility] [address], [place], [place] (336) 342-8316 Insurance/Medicaid/sponsorship through [facility]  °[facility] [address]                                    [place], [place] (336) 342-8316 Therapy/tele-psych/case  °[facility] [address]  ° [place], [place] (336) 349-2233    °Dr. Arfeen  (336) 349-4544   °[facility] [place]  [employer] [place] [facility] 1) [address],  °2) [address], [place] °3)  [address], [place] (336) 349-3220 °(336) 342-7768 ° °(336) 342-8140   °[place] [facility] (336) 342-1394 or (336) 342-3537 (After Hours)    ° °  °

## 2014-05-31 NOTE — ED Provider Notes (Signed)
CSN: [id number]     Arrival date & time 05/31/14  1028 History   First MD Initiated Contact with Patient 05/31/14 1058     Chief Complaint  Patient presents with  . Dental Pain     (Consider location/radiation/quality/duration/timing/severity/associated sxs/prior Treatment) HPI  Derrick Mcdonald is a 29 y.o. male who presents to the Emergency Department complaining of dental pain for one day.  He reports pain to the right lower molars.  He has history of recurrent dental pain to same area.  Pain is worse with chewing, hot or cold foods or fluids.  He denies fever, facial swelling, neck pain or difficulty swallowing.     History reviewed. No pertinent past medical history. Past Surgical History  Procedure Laterality Date  . Hernia repair     History reviewed. No pertinent family history. History  Substance Use Topics  . Smoking status: Current Every Day Smoker -- 0.50 packs/day for 15 years    Types: Cigarettes  . Smokeless tobacco: Not on file  . Alcohol Use: No    Review of Systems  Constitutional: Negative for fever and appetite change.  HENT: Positive for dental problem. Negative for congestion, facial swelling, sore throat and trouble swallowing.   Eyes: Negative for pain and visual disturbance.  Musculoskeletal: Negative for neck pain and neck stiffness.  Neurological: Negative for dizziness, facial asymmetry and headaches.  Hematological: Negative for adenopathy.  All other systems reviewed and are negative.     Allergies  Review of patient's allergies indicates no known allergies.  Home Medications   Prior to Admission medications   Medication Sig Start Date End Date Taking? Authorizing Provider  acetaminophen (TYLENOL) 500 MG tablet Take 1,500 mg by mouth 2 (two) times daily as needed for moderate pain.    Historical Provider, MD  amoxicillin (AMOXIL) 500 MG capsule Take 1 capsule (500 mg total) by mouth 3 (three) times daily. 03/23/14   Burgess AmorJulie Idol, PA-C   clindamycin (CLEOCIN) 150 MG capsule Take 1 capsule (150 mg total) by mouth every 6 (six) hours. 04/21/13   Burgess AmorJulie Idol, PA-C  HYDROcodone-acetaminophen (NORCO/VICODIN) 5-325 MG per tablet Take 1 tablet by mouth every 4 (four) hours as needed. 06/27/13   Hope Orlene OchM Neese, NP  ibuprofen (ADVIL,MOTRIN) 800 MG tablet Take 1 tablet (800 mg total) by mouth 3 (three) times daily. 06/27/13   Hope Orlene OchM Neese, NP  oxyCODONE-acetaminophen (PERCOCET/ROXICET) 5-325 MG per tablet Take 1 tablet by mouth every 4 (four) hours as needed for severe pain. 04/21/13   Burgess AmorJulie Idol, PA-C  traMADol (ULTRAM) 50 MG tablet Take 1 tablet (50 mg total) by mouth every 6 (six) hours as needed. 03/23/14   Burgess AmorJulie Idol, PA-C   BP 137/88 mmHg  Pulse 78  Temp(Src) 98.4 F (36.9 C) (Oral)  Resp 18  Ht 6\' 2"  (1.88 m)  Wt 260 lb (117.935 kg)  BMI 33.37 kg/m2  SpO2 100% Physical Exam  Constitutional: He is oriented to person, place, and time. He appears well-developed and well-nourished. No distress.  HENT:  Head: Normocephalic and atraumatic.  Right Ear: Tympanic membrane and ear canal normal.  Left Ear: Tympanic membrane and ear canal normal.  Mouth/Throat: Uvula is midline, oropharynx is clear and moist and mucous membranes are normal. No trismus in the jaw. Dental caries present. No dental abscesses or uvula swelling.  Tenderness and dental caries of the right lower first molar  No facial swelling, obvious dental abscess, trismus, or sublingual abnml.    Neck: Normal range  of motion. Neck supple.  Cardiovascular: Normal rate, regular rhythm and normal heart sounds.   No murmur heard. Pulmonary/Chest: Effort normal and breath sounds normal. No respiratory distress.  Musculoskeletal: Normal range of motion.  Lymphadenopathy:    He has no cervical adenopathy.  Neurological: He is alert and oriented to person, place, and time. He exhibits normal muscle tone. Coordination normal.  Skin: Skin is warm and dry.  Nursing note and vitals  reviewed.   ED Course  Procedures (including critical care time) Labs Review Labs Reviewed - No data to display  Imaging Review No results found.   EKG Interpretation None      MDM   Final diagnoses:  Pain, dental    Pt is well appearing, VSS.  No concerning sx's for Ludwig's angina.  Pt agrees to close f/u with a dentist, given referral info.  Appears stable for d/c    Pauline Ausammy Trea Latner, PA-C 06/01/14 2205  Glynn OctaveStephen Rancour, MD 06/02/14 [phone]

## 2014-05-31 NOTE — ED Notes (Signed)
PT c/o right lower dental pain x1 day with no reported injury to teeth but known dental problems in same area.

## 2014-07-07 ENCOUNTER — Encounter (HOSPITAL_COMMUNITY): Payer: Self-pay

## 2014-07-07 ENCOUNTER — Emergency Department (HOSPITAL_COMMUNITY): Payer: Managed Care, Other (non HMO)

## 2014-07-07 ENCOUNTER — Emergency Department (HOSPITAL_COMMUNITY)
Admission: EM | Admit: 2014-07-07 | Discharge: 2014-07-07 | Disposition: A | Discharge status: Home / Self Care | Payer: Managed Care, Other (non HMO) | Attending: Emergency Medicine | Admitting: Emergency Medicine

## 2014-07-07 DIAGNOSIS — Z720 Tobacco use: Secondary | ICD-10-CM | POA: Insufficient documentation

## 2014-07-07 DIAGNOSIS — M722 Plantar fascial fibromatosis: Secondary | ICD-10-CM | POA: Diagnosis not present

## 2014-07-07 DIAGNOSIS — Z79899 Other long term (current) drug therapy: Secondary | ICD-10-CM | POA: Insufficient documentation

## 2014-07-07 DIAGNOSIS — M79671 Pain in right foot: Secondary | ICD-10-CM | POA: Diagnosis present

## 2014-07-07 MED ORDER — HYDROCODONE-ACETAMINOPHEN 5-325 MG PO TABS: 2.0000 | ORAL_TABLET | ORAL | Status: AC | PRN

## 2014-07-07 MED ORDER — MELOXICAM 7.5 MG PO TABS: 7.5000 mg | ORAL_TABLET | Freq: Every day | ORAL | Status: DC

## 2014-07-07 NOTE — ED Notes (Signed)
Pain rt foot for 1 week, hurts worse in morning when getting OOB.  Pain mostly in heel . No injury.

## 2014-07-07 NOTE — Discharge Instructions (Signed)
Plantar Fasciitis  Plantar fasciitis is a common condition that causes foot pain. It is soreness (inflammation) of the band of tough fibrous tissue on the bottom of the foot that runs from the heel bone (calcaneus) to the ball of the foot. The cause of this soreness may be from excessive standing, poor fitting shoes, running on hard surfaces, being overweight, having an abnormal walk, or overuse (this is common in runners) of the painful foot or feet. It is also common in aerobic exercise dancers and ballet dancers.  SYMPTOMS   Most people with plantar fasciitis complain of:   Severe pain in the morning on the bottom of their foot especially when taking the first steps out of bed. This pain recedes after a few minutes of walking.   Severe pain is experienced also during walking following a long period of inactivity.   Pain is worse when walking barefoot or up stairs  DIAGNOSIS    Your caregiver will diagnose this condition by examining and feeling your foot.   Special tests such as X-rays of your foot, are usually not needed.  PREVENTION    Consult a sports medicine professional before beginning a new exercise program.   Walking programs offer a good workout. With walking there is a lower chance of overuse injuries common to runners. There is less impact and less jarring of the joints.   Begin all new exercise programs slowly. If problems or pain develop, decrease the amount of time or distance until you are at a comfortable level.   Wear good shoes and replace them regularly.   Stretch your foot and the heel cords at the back of the ankle (Achilles tendon) both before and after exercise.   Run or exercise on even surfaces that are not hard. For example, asphalt is better than pavement.   Do not run barefoot on hard surfaces.   If using a treadmill, vary the incline.   Do not continue to workout if you have foot or joint problems. Seek professional help if they do not improve.  HOME CARE INSTRUCTIONS     Avoid activities that cause you pain until you recover.   Use ice or cold packs on the problem or painful areas after working out.   Only take over-the-counter or prescription medicines for pain, discomfort, or fever as directed by your caregiver.   Soft shoe inserts or athletic shoes with air or gel sole cushions may be helpful.   If problems continue or become more severe, consult a sports medicine caregiver or your own health care provider. Cortisone is a potent anti-inflammatory medication that may be injected into the painful area. You can discuss this treatment with your caregiver.  MAKE SURE YOU:    Understand these instructions.   Will watch your condition.   Will get help right away if you are not doing well or get worse.  Document Released: 09/27/2000 Document Revised: 03/27/2011 Document Reviewed: 11/27/2007  ExitCare Patient Information 2015 [employer]. This information is not intended to replace advice given to you by your health care provider. Make sure you discuss any questions you have with your health care provider.

## 2014-07-07 NOTE — ED Notes (Signed)
Pt c/o pain to r foot x 2 days.  Pt says the pain is in the arch of his foot and the heel.  Denies injury.

## 2014-07-07 NOTE — ED Provider Notes (Signed)
CSN: [id number]     Arrival date & time 07/07/14  1036 History   First MD Initiated Contact with Patient 07/07/14 1047     Chief Complaint  Patient presents with  . Foot Pain     (Consider location/radiation/quality/duration/timing/severity/associated sxs/prior Treatment) Patient is a 29 y.o. male presenting with foot injury. The history is provided by the patient. No language interpreter was used.  Foot Injury Location:  Foot Time since incident:  1 week Foot location:  R foot Pain details:    Quality:  Aching   Radiates to:  Does not radiate   Severity:  Moderate   Duration:  1 week   Timing:  Constant   Progression:  Worsening Chronicity:  New Dislocation: no   Tetanus status:  Unknown Relieved by:  Nothing Worsened by:  Nothing tried Ineffective treatments:  None tried Associated symptoms: swelling   Risk factors: recent illness   Pt complains of soreness in the bottom of her foot   History reviewed. No pertinent past medical history. Past Surgical History  Procedure Laterality Date  . Hernia repair     No family history on file. History  Substance Use Topics  . Smoking status: Current Every Day Smoker -- 0.50 packs/day for 15 years    Types: Cigarettes  . Smokeless tobacco: Not on file  . Alcohol Use: No    Review of Systems  Musculoskeletal: Positive for myalgias and joint swelling.  All other systems reviewed and are negative.     Allergies  Review of patient's allergies indicates no known allergies.  Home Medications   Prior to Admission medications   Medication Sig Start Date End Date Taking? Authorizing Provider  acetaminophen (TYLENOL) 500 MG tablet Take 1,500 mg by mouth 2 (two) times daily as needed for moderate pain.   Yes Historical Provider, MD  omeprazole (PRILOSEC) 20 MG capsule Take 20 mg by mouth daily.   Yes Historical Provider, MD  amoxicillin (AMOXIL) 500 MG capsule Take 1 capsule (500 mg total) by mouth 3 (three) times  daily. Patient not taking: Reported on 07/07/2014 03/23/14   Burgess Amor, PA-C  HYDROcodone-acetaminophen (NORCO/VICODIN) 5-325 MG per tablet Take 1 tablet by mouth every 4 (four) hours as needed. Patient not taking: Reported on 07/07/2014 06/27/13   Janne Napoleon, NP  ibuprofen (ADVIL,MOTRIN) 800 MG tablet Take 1 tablet (800 mg total) by mouth 3 (three) times daily. Patient not taking: Reported on 07/07/2014 06/27/13   Janne Napoleon, NP  traMADol (ULTRAM) 50 MG tablet Take 1 tablet (50 mg total) by mouth every 6 (six) hours as needed. Patient not taking: Reported on 07/07/2014 03/23/14   Burgess Amor, PA-C   BP 138/80 mmHg  Pulse 87  Temp(Src) 98.5 F (36.9 C) (Oral)  Resp 18  Ht  (1.88 m)  Wt 270 lb (122.471 kg)  BMI 34.65 kg/m2  SpO2 100% Physical Exam  Constitutional: He is oriented to person, place, and time. He appears well-developed and well-nourished.  Musculoskeletal: He exhibits tenderness.  Neurological: He is alert and oriented to person, place, and time. He has normal reflexes.  Skin: Skin is warm.  Psychiatric: He has a normal mood and affect.  Nursing note and vitals reviewed.   ED Course  Procedures (including critical care time) Labs Review Labs Reviewed - No data to display  Imaging Review Dg Foot Complete Right  07/07/2014   CLINICAL DATA:  Two day history of pain. No history of recent trauma.  EXAM: RIGHT FOOT  COMPLETE - 3+ VIEW  COMPARISON:  None.  FINDINGS: Frontal, oblique, and lateral views obtained. There is no demonstrable fracture or dislocation. Joint spaces appear intact. There is a small benign exostosis arising from the dorsal talus.  IMPRESSION: No fracture or dislocation.  No appreciable arthropathy.   Electronically Signed   By: Bretta Bang III M.D.   On: 07/07/2014 11:20     EKG Interpretation None      MDM   Final diagnoses:  Plantar fasciitis    Pt placed in a post op shoe referal to Dr. Hilda Lias and Dr. Charlsie Merles.   meloxicam hydrocodone    Lonia Skinner Melrose, PA-C 07/07/14 1633  Samuel Jester, DO 07/10/14 3419

## 2014-09-16 ENCOUNTER — Encounter (HOSPITAL_COMMUNITY): Payer: Self-pay | Admitting: *Deleted

## 2014-09-16 ENCOUNTER — Emergency Department (HOSPITAL_COMMUNITY)
Admission: EM | Admit: 2014-09-16 | Discharge: 2014-09-16 | Disposition: A | Discharge status: Home / Self Care | Payer: PRIVATE HEALTH INSURANCE | Attending: Emergency Medicine | Admitting: Emergency Medicine

## 2014-09-16 DIAGNOSIS — K088 Other specified disorders of teeth and supporting structures: Secondary | ICD-10-CM | POA: Diagnosis present

## 2014-09-16 DIAGNOSIS — Z79899 Other long term (current) drug therapy: Secondary | ICD-10-CM | POA: Diagnosis not present

## 2014-09-16 DIAGNOSIS — Z720 Tobacco use: Secondary | ICD-10-CM | POA: Diagnosis not present

## 2014-09-16 DIAGNOSIS — K029 Dental caries, unspecified: Secondary | ICD-10-CM | POA: Insufficient documentation

## 2014-09-16 MED ORDER — IBUPROFEN 800 MG PO TABS
800.0000 mg | ORAL_TABLET | Freq: Once | ORAL | Status: AC
  Administered 2014-09-16: 800 mg via ORAL
  Filled 2014-09-16: qty 1

## 2014-09-16 MED ORDER — ACETAMINOPHEN 500 MG PO TABS
500.0000 mg | ORAL_TABLET | Freq: Once | ORAL | Status: AC
  Administered 2014-09-16: 500 mg via ORAL
  Filled 2014-09-16: qty 1

## 2014-09-16 MED ORDER — IBUPROFEN 800 MG PO TABS: 800.0000 mg | ORAL_TABLET | Freq: Three times a day (TID) | ORAL | Status: DC

## 2014-09-16 MED ORDER — AMOXICILLIN 250 MG PO CAPS
500.0000 mg | ORAL_CAPSULE | Freq: Once | ORAL | Status: AC
  Administered 2014-09-16: 500 mg via ORAL
  Filled 2014-09-16: qty 2

## 2014-09-16 MED ORDER — AMOXICILLIN 500 MG PO CAPS: 500.0000 mg | ORAL_CAPSULE | Freq: Three times a day (TID) | ORAL | Status: AC

## 2014-09-16 NOTE — ED Provider Notes (Signed)
CSN: [id number]     Arrival date & time 09/16/14  0903 History   First MD Initiated Contact with Patient 09/16/14 [phone]     Chief Complaint  Patient presents with  . Dental Pain     (Consider location/radiation/quality/duration/timing/severity/associated sxs/prior Treatment) Patient is a 29 y.o. male presenting with tooth pain. The history is provided by the patient.  Dental Pain Location:  Lower Severity:  Moderate Onset quality:  Gradual Duration:  2 days Timing:  Intermittent Progression:  Worsening Chronicity:  Chronic Context: dental caries and poor dentition   Context: not abscess and not trauma   Relieved by:  Nothing Ineffective treatments:  Acetaminophen Associated symptoms: facial pain   Associated symptoms: no difficulty swallowing, no drooling, no fever and no trismus   Risk factors: lack of dental care and smoking     History reviewed. No pertinent past medical history. Past Surgical History  Procedure Laterality Date  . Hernia repair     No family history on file. Social History  Substance Use Topics  . Smoking status: Current Every Day Smoker -- 0.50 packs/day for 15 years    Types: Cigarettes  . Smokeless tobacco: None  . Alcohol Use: No    Review of Systems  Constitutional: Negative for fever.  HENT: Negative for drooling.   All other systems reviewed and are negative.     Allergies  Review of patient's allergies indicates no known allergies.  Home Medications   Prior to Admission medications   Medication Sig Start Date End Date Taking? Authorizing Provider  acetaminophen (TYLENOL) 500 MG tablet Take 1,500 mg by mouth 2 (two) times daily as needed for moderate pain.   Yes Historical Provider, MD  ibuprofen (ADVIL,MOTRIN) 200 MG tablet Take 400 mg by mouth every 6 (six) hours as needed.   Yes Historical Provider, MD  amoxicillin (AMOXIL) 500 MG capsule Take 1 capsule (500 mg total) by mouth 3 (three) times daily. Patient not taking: Reported  on 07/07/2014 03/23/14   Burgess Amor, PA-C  HYDROcodone-acetaminophen (NORCO/VICODIN) 5-325 MG per tablet Take 2 tablets by mouth every 4 (four) hours as needed. Patient not taking: Reported on 09/16/2014 07/07/14   Elson Areas, PA-C  ibuprofen (ADVIL,MOTRIN) 800 MG tablet Take 1 tablet (800 mg total) by mouth 3 (three) times daily. Patient not taking: Reported on 07/07/2014 06/27/13   Janne Napoleon, NP  meloxicam (MOBIC) 7.5 MG tablet Take 1 tablet (7.5 mg total) by mouth daily. Patient not taking: Reported on 09/16/2014 07/07/14   Elson Areas, PA-C  omeprazole (PRILOSEC) 20 MG capsule Take 20 mg by mouth daily.    Historical Provider, MD  traMADol (ULTRAM) 50 MG tablet Take 1 tablet (50 mg total) by mouth every 6 (six) hours as needed. Patient not taking: Reported on 07/07/2014 03/23/14   Burgess Amor, PA-C   BP 144/89 mmHg  Pulse 79  Temp(Src) 99 F (37.2 C) (Oral)  Resp 16  SpO2 99% Physical Exam  Constitutional: He is oriented to person, place, and time. He appears well-developed and well-nourished.  Non-toxic appearance.  HENT:  Head: Normocephalic.  Right Ear: Tympanic membrane and external ear normal.  Left Ear: Tympanic membrane and external ear normal.  Mouth/Throat: No trismus in the jaw. Dental caries present. No dental abscesses.  Right lower molars decayed to the gum line. No visible abscess. No swelling under the tongue. Airway patent.  Eyes: EOM and lids are normal. Pupils are equal, round, and reactive to light.  Neck: Normal  range of motion. Neck supple. Carotid bruit is not present.  Cardiovascular: Normal rate, regular rhythm, normal heart sounds, intact distal pulses and normal pulses.   Pulmonary/Chest: Breath sounds normal. No respiratory distress.  Abdominal: Soft. Bowel sounds are normal. There is no tenderness. There is no guarding.  Musculoskeletal: Normal range of motion.  Lymphadenopathy:       Head (right side): No submandibular adenopathy present.       Head  (left side): No submandibular adenopathy present.    He has no cervical adenopathy.  Neurological: He is alert and oriented to person, place, and time. He has normal strength. No cranial nerve deficit or sensory deficit.  Skin: Skin is warm and dry.  Psychiatric: He has a normal mood and affect. His speech is normal.  Nursing note and vitals reviewed.   ED Course  Procedures (including critical care time) Labs Review Labs Reviewed - No data to display  Imaging Review No results found. I have personally reviewed and evaluated these images and lab results as part of my medical decision-making.   EKG Interpretation None      MDM  Vital signs reviewed. Patient has history of dental issues involving the right lower jaw area. He now has some pain. He has been seen initially by dentists, but cannot have anything done until the infection has improved. He was told by dentist to come to the emergency department to get anti-biotics, and to come back to see them in 1 or 2 weeks, and after his dental insurance has initiated. Patient treated with prescriptions for Amoxil and ibuprofen.    Final diagnoses:  None    *I have reviewed nursing notes, vital signs, and all appropriate lab and imaging results for this patient.    Ivery Quale, PA-C 09/16/14 1123  Geoffery Lyons, MD 09/16/14 1415

## 2014-09-16 NOTE — ED Notes (Signed)
Dental pain to right lower molar x 2 days.

## 2014-09-16 NOTE — Discharge Instructions (Signed)
Dental Caries °Dental caries is tooth decay. This decay can cause a hole in teeth (cavity) that can get bigger and deeper over time. °HOME CARE °· Brush and floss your teeth. Do this at least two times a day. °· Use a fluoride toothpaste. °· Use a mouth rinse if told by your dentist or doctor. °· Eat less sugary and starchy foods. Drink less sugary drinks. °· Avoid snacking often on sugary and starchy foods. Avoid sipping often on sugary drinks. °· Keep regular checkups and cleanings with your dentist. °· Use fluoride supplements if told by your dentist or doctor. °· Allow fluoride to be applied to teeth if told by your dentist or doctor. °Document Released: 10/12/2007 Document Revised: 05/19/2013 Document Reviewed: 01/05/2012 °Exit[employer]® Patient Information ©2015 [employer]. This information is not intended to replace advice given to you by your health care provider. Make sure you discuss any questions you have with your health care provider. ° °

## 2014-11-18 ENCOUNTER — Emergency Department (HOSPITAL_COMMUNITY)
Admission: EM | Admit: 2014-11-18 | Discharge: 2014-11-18 | Disposition: A | Discharge status: Home / Self Care | Payer: Managed Care, Other (non HMO) | Attending: Emergency Medicine | Admitting: Emergency Medicine

## 2014-11-18 ENCOUNTER — Encounter (HOSPITAL_COMMUNITY): Payer: Self-pay | Admitting: *Deleted

## 2014-11-18 DIAGNOSIS — Z720 Tobacco use: Secondary | ICD-10-CM | POA: Diagnosis not present

## 2014-11-18 DIAGNOSIS — Z792 Long term (current) use of antibiotics: Secondary | ICD-10-CM | POA: Diagnosis not present

## 2014-11-18 DIAGNOSIS — Z791 Long term (current) use of non-steroidal anti-inflammatories (NSAID): Secondary | ICD-10-CM | POA: Diagnosis not present

## 2014-11-18 DIAGNOSIS — Z79899 Other long term (current) drug therapy: Secondary | ICD-10-CM | POA: Diagnosis not present

## 2014-11-18 DIAGNOSIS — K029 Dental caries, unspecified: Secondary | ICD-10-CM | POA: Insufficient documentation

## 2014-11-18 DIAGNOSIS — K0889 Other specified disorders of teeth and supporting structures: Secondary | ICD-10-CM | POA: Diagnosis not present

## 2014-11-18 DIAGNOSIS — K0381 Cracked tooth: Secondary | ICD-10-CM | POA: Insufficient documentation

## 2014-11-18 MED ORDER — PENICILLIN V POTASSIUM 250 MG PO TABS: 250.0000 mg | ORAL_TABLET | Freq: Four times a day (QID) | ORAL | Status: AC

## 2014-11-18 NOTE — ED Provider Notes (Signed)
CSN: [id number]     Arrival date & time 11/18/14  1452 History   First MD Initiated Contact with Patient 11/18/14 1500     Chief Complaint  Patient presents with  . Dental Pain     (Consider location/radiation/quality/duration/timing/severity/associated sxs/prior Treatment) HPI Comments: Pt comes in with c/o right lower dental pain for the last couple of days. He states that he broke the tooth a while ago but has not had any problem. Denies fever, problems swallowing or breathing. Has taken ibuprofen without much relief  The history is provided by the patient. No language interpreter was used.    History reviewed. No pertinent past medical history. Past Surgical History  Procedure Laterality Date  . Hernia repair     History reviewed. No pertinent family history. Social History  Substance Use Topics  . Smoking status: Current Every Day Smoker -- 0.50 packs/day for 15 years    Types: Cigarettes  . Smokeless tobacco: None  . Alcohol Use: No    Review of Systems  All other systems reviewed and are negative.     Allergies  Review of patient's allergies indicates no known allergies.  Home Medications   Prior to Admission medications   Medication Sig Start Date End Date Taking? Authorizing Provider  acetaminophen (TYLENOL) 500 MG tablet Take 1,500 mg by mouth 2 (two) times daily as needed for moderate pain.    Historical Provider, MD  amoxicillin (AMOXIL) 500 MG capsule Take 1 capsule (500 mg total) by mouth 3 (three) times daily. 09/16/14   Ivery Quale, PA-C  HYDROcodone-acetaminophen (NORCO/VICODIN) 5-325 MG per tablet Take 2 tablets by mouth every 4 (four) hours as needed. Patient not taking: Reported on 09/16/2014 07/07/14   Elson Areas, PA-C  ibuprofen (ADVIL,MOTRIN) 800 MG tablet Take 1 tablet (800 mg total) by mouth 3 (three) times daily. 09/16/14   Ivery Quale, PA-C  meloxicam (MOBIC) 7.5 MG tablet Take 1 tablet (7.5 mg total) by mouth daily. Patient not taking:  Reported on 09/16/2014 07/07/14   Elson Areas, PA-C  omeprazole (PRILOSEC) 20 MG capsule Take 20 mg by mouth daily.    Historical Provider, MD  penicillin v potassium (VEETID) 250 MG tablet Take 1 tablet (250 mg total) by mouth 4 (four) times daily. 11/18/14 11/25/14  Teressa Lower, NP  traMADol (ULTRAM) 50 MG tablet Take 1 tablet (50 mg total) by mouth every 6 (six) hours as needed. Patient not taking: Reported on 07/07/2014 03/23/14   Burgess Amor, PA-C   BP 146/81 mmHg  Pulse 97  Temp(Src) 98.6 F (37 C) (Oral)  Resp 18  Ht  (1.88 m)  Wt 260 lb (117.935 kg)  BMI 33.37 kg/m2  SpO2 100% Physical Exam  Constitutional: He is oriented to person, place, and time. He appears well-developed and well-nourished.  HENT:  Right Ear: External ear normal.  Left Ear: External ear normal.  Mouth/Throat:    Cardiovascular: Normal rate and regular rhythm.   Pulmonary/Chest: Effort normal.  Musculoskeletal: Normal range of motion.  Neurological: He is alert and oriented to person, place, and time.  Skin: Skin is warm and dry.  Nursing note and vitals reviewed.   ED Course  Procedures (including critical care time) Labs Review Labs Reviewed - No data to display  Imaging Review No results found. I have personally reviewed and evaluated these images and lab results as part of my medical decision-making.   EKG Interpretation None      MDM   Final diagnoses:  Toothache   Given pcn.no sign of ludwigs angina    Teressa LowerVrinda Alexy Bringle, NP 11/18/14 1511  Samuel JesterKathleen McManus, DO 11/19/14 [phone]

## 2014-11-18 NOTE — ED Notes (Signed)
Patient c/o dental pain to right lower jaw. States he broke off a tooth and does not have insurance. Pain has been worse lately and states jaw is swollen.

## 2015-02-01 ENCOUNTER — Encounter (HOSPITAL_COMMUNITY): Payer: Self-pay | Admitting: Emergency Medicine

## 2015-02-01 ENCOUNTER — Emergency Department (HOSPITAL_COMMUNITY)
Admission: EM | Admit: 2015-02-01 | Discharge: 2015-02-01 | Disposition: A | Discharge status: Home / Self Care | Payer: Managed Care, Other (non HMO) | Attending: Emergency Medicine | Admitting: Emergency Medicine

## 2015-02-01 DIAGNOSIS — S80861A Insect bite (nonvenomous), right lower leg, initial encounter: Secondary | ICD-10-CM | POA: Diagnosis present

## 2015-02-01 DIAGNOSIS — Y9289 Other specified places as the place of occurrence of the external cause: Secondary | ICD-10-CM | POA: Diagnosis not present

## 2015-02-01 DIAGNOSIS — F17210 Nicotine dependence, cigarettes, uncomplicated: Secondary | ICD-10-CM | POA: Insufficient documentation

## 2015-02-01 DIAGNOSIS — Y998 Other external cause status: Secondary | ICD-10-CM | POA: Insufficient documentation

## 2015-02-01 DIAGNOSIS — Z79899 Other long term (current) drug therapy: Secondary | ICD-10-CM | POA: Insufficient documentation

## 2015-02-01 DIAGNOSIS — Z792 Long term (current) use of antibiotics: Secondary | ICD-10-CM | POA: Insufficient documentation

## 2015-02-01 DIAGNOSIS — Y9389 Activity, other specified: Secondary | ICD-10-CM | POA: Insufficient documentation

## 2015-02-01 DIAGNOSIS — W57XXXA Bitten or stung by nonvenomous insect and other nonvenomous arthropods, initial encounter: Secondary | ICD-10-CM | POA: Insufficient documentation

## 2015-02-01 MED ORDER — DOXYCYCLINE HYCLATE 100 MG PO CAPS: 100.0000 mg | ORAL_CAPSULE | Freq: Two times a day (BID) | ORAL | Status: AC

## 2015-02-01 MED ORDER — IBUPROFEN 800 MG PO TABS: 800.0000 mg | ORAL_TABLET | Freq: Three times a day (TID) | ORAL | Status: AC

## 2015-02-01 MED ORDER — LIDOCAINE-EPINEPHRINE 1 %-1:200000 IJ SOLN
INTRAMUSCULAR | Status: AC
Start: 2015-02-01 — End: 2015-02-01
  Administered 2015-02-01: 20:00:00
  Filled 2015-02-01: qty 10

## 2015-02-01 NOTE — ED Notes (Signed)
Patient states he has tick on back of right leg that needs removing. Tick noted to back of right leg.

## 2015-02-01 NOTE — Discharge Instructions (Signed)

## 2015-02-01 NOTE — ED Provider Notes (Signed)
CSN: [id number]     Arrival date & time 02/01/15  1900 History   First MD Initiated Contact with Patient 02/01/15 1938     Chief Complaint  Patient presents with  . Tick Removal     (Consider location/radiation/quality/duration/timing/severity/associated sxs/prior Treatment) HPI  30 year old male, comes in with tick on the right calf, there is already a rash surrounding the area which is a target-like rash. He is unsure how long it has been there but has no systemic symptoms. He has not tried to pull a tick out.  History reviewed. No pertinent past medical history. Past Surgical History  Procedure Laterality Date  . Hernia repair     History reviewed. No pertinent family history. Social History  Substance Use Topics  . Smoking status: Current Every Day Smoker -- 0.50 packs/day for 15 years    Types: Cigarettes  . Smokeless tobacco: None  . Alcohol Use: Yes     Comment: occasionally    Review of Systems  Constitutional: Negative for fever.  Skin: Positive for rash.      Allergies  Review of patient's allergies indicates no known allergies.  Home Medications   Prior to Admission medications   Medication Sig Start Date End Date Taking? Authorizing Provider  acetaminophen (TYLENOL) 500 MG tablet Take 1,500 mg by mouth 2 (two) times daily as needed for moderate pain.    Historical Provider, MD  amoxicillin (AMOXIL) 500 MG capsule Take 1 capsule (500 mg total) by mouth 3 (three) times daily. 09/16/14   Ivery Quale, PA-C  doxycycline (VIBRAMYCIN) 100 MG capsule Take 1 capsule (100 mg total) by mouth 2 (two) times daily. 02/01/15   Eber Hong, MD  HYDROcodone-acetaminophen (NORCO/VICODIN) 5-325 MG per tablet Take 2 tablets by mouth every 4 (four) hours as needed. Patient not taking: Reported on 09/16/2014 07/07/14   Elson Areas, PA-C  ibuprofen (ADVIL,MOTRIN) 800 MG tablet Take 1 tablet (800 mg total) by mouth 3 (three) times daily. 02/01/15   Eber Hong, MD  omeprazole  (PRILOSEC) 20 MG capsule Take 20 mg by mouth daily.    Historical Provider, MD   BP 142/90 mmHg  Pulse 91  Temp(Src) 98.6 F (37 C) (Oral)  Resp 16  Ht 6\' 2"  (1.88 m)  Wt 271 lb (122.925 kg)  BMI 34.78 kg/m2  SpO2 99% Physical Exam  Constitutional: He appears well-developed and well-nourished.  HENT:  Head: Normocephalic and atraumatic.  Eyes: Conjunctivae are normal. Right eye exhibits no discharge. Left eye exhibits no discharge.  Pulmonary/Chest: Effort normal. No respiratory distress.  Musculoskeletal:  Proximal posterior right lower extremity below the knee, small tick, surrounding 1 cm of rash which is a target-like rash. No tenderness, no purulence, no induration  Neurological: He is alert. Coordination normal.  Skin: Skin is warm and dry. No rash noted. He is not diaphoretic. No erythema.  Psychiatric: He has a normal mood and affect.  Nursing note and vitals reviewed.   ED Course  Procedures (including critical care time) Labs Review Labs Reviewed - No data to display  Imaging Review No results found. I have personally reviewed and evaluated these images and lab results as part of my medical decision-making.    MDM   Final diagnoses:  Tick bite of calf, right, initial encounter    Well-appearing, vital signs with mild hypertension but no other findings. The patient is stable, I have removed the tick manually with a para forceps, the patient appears stable for discharge  Meds given in  ED:  Medications  lidocaine-EPINEPHrine (XYLOCAINE-EPINEPHrine) 1 %-1:200000 (PF) injection (not administered)    New Prescriptions   DOXYCYCLINE (VIBRAMYCIN) 100 MG CAPSULE    Take 1 capsule (100 mg total) by mouth 2 (two) times daily.   IBUPROFEN (ADVIL,MOTRIN) 800 MG TABLET    Take 1 tablet (800 mg total) by mouth 3 (three) times daily.        Eber HongBrian Shreena Baines, MD 02/01/15 [phone]

## 2017-01-11 ENCOUNTER — Emergency Department (HOSPITAL_COMMUNITY)
Admission: EM | Admit: 2017-01-11 | Discharge: 2017-01-11 | Disposition: A | Discharge status: Home / Self Care | Payer: Managed Care, Other (non HMO) | Attending: Emergency Medicine | Admitting: Emergency Medicine

## 2017-01-11 ENCOUNTER — Other Ambulatory Visit: Payer: Self-pay

## 2017-01-11 ENCOUNTER — Encounter (HOSPITAL_COMMUNITY): Payer: Self-pay

## 2017-01-11 DIAGNOSIS — F17210 Nicotine dependence, cigarettes, uncomplicated: Secondary | ICD-10-CM | POA: Insufficient documentation

## 2017-01-11 DIAGNOSIS — T7840XA Allergy, unspecified, initial encounter: Secondary | ICD-10-CM | POA: Insufficient documentation

## 2017-01-11 DIAGNOSIS — Z79899 Other long term (current) drug therapy: Secondary | ICD-10-CM | POA: Diagnosis not present

## 2017-01-11 DIAGNOSIS — R2230 Localized swelling, mass and lump, unspecified upper limb: Secondary | ICD-10-CM | POA: Diagnosis present

## 2017-01-11 MED ORDER — PREDNISONE 20 MG PO TABS: 40.0000 mg | ORAL_TABLET | Freq: Every day | ORAL | 0 refills | Status: AC

## 2017-01-11 MED ORDER — PREDNISONE 20 MG PO TABS
40.0000 mg | ORAL_TABLET | Freq: Once | ORAL | Status: AC
  Administered 2017-01-11: 40 mg via ORAL
  Filled 2017-01-11: qty 2

## 2017-01-11 NOTE — Discharge Instructions (Signed)
You may need to follow-up with your family doctor for formal testing to see what you may be allergic to.  Please make an appointment with your doctor in the next 2 weeks to arrange this testing.  In the meantime he will need treatment for this allergic reaction including prednisone 40 mg by mouth daily for 5 days to be taken with food and water  Benadryl, up to 75 mg for severe itching, every 6 hours as needed for rash and itching and swelling (start with 25 mg and add more if needed)  You should also take Pepcid or Zantac twice a day for the next 2 weeks.  Please return to the emergency department for severe or worsening swelling, rash, itching, swelling of the face the eyes the lips the tongue or difficulty breathing or speaking or any wheezing.  If you do not have a family doctor see the list below.  [facility]    Kari BaarsEdward Hawkins MD. Specialty: Pulmonary Disease Contact information: [address]  [address]  [place] [address]  [phone]   Syliva OvermanMargaret Simpson, MD. Specialty: [facility] Contact information: [address]  [place] [address]  [phone]   Lilyan PuntScott Luking, MD. Specialty: [facility] Contact information: [address]  [address]  [place] [address]  [phone]   Avon Gullyesfaye Fanta, MD Specialty: Internal Medicine Contact information: [address]  [place] [address]  [phone]   Catalina PizzaZach Hall, MD. Specialty: Internal Medicine Contact information: [address]  [place] [address]  [phone]    [facility] (Dr. Selena BattenKim) Specialty: Family Medicine Contact information: [address]  [place] [address]  [phone]   John GiovanniStephen Knowlton, MD. Specialty: [facility] Contact information: [address]  [address]  [place] [address]  [phone]   Carylon Perchesoy Fagan, MD. Specialty: Internal Medicine Contact information: [address]  [address]  [place] [address]    [phone]    [facility]  [address] [place] [address] [phone]  Services [facility] offers a variety of basic health services.  Services include but are not limited to: Blood pressure checks  Heart rate checks  Blood sugar checks  Urine analysis  Rapid strep tests  Pregnancy tests.  Health education and referrals  People needing more complex services will be directed to a physician online. Using these virtual visits, doctors can evaluate and prescribe medicine and treatments. There will be no medication on-site, though [facility] will help patients fill their prescriptions at little to no cost.   For More information please go to: [email or web address]

## 2017-01-11 NOTE — ED Provider Notes (Signed)
[facility] EMERGENCY DEPARTMENT Provider Note   CSN: [id number] Arrival date & time: 01/11/17  1233     History   Chief Complaint Chief Complaint  Patient presents with  . Pruritis    HPI Derrick Mcdonald is a 31 y.o. male.  HPI  The patient is a 31 year old male, he is not currently being treated for any chronic medical conditions and takes no daily medications, he presents to the hospital today with a complaint of bilateral hand swelling and itching which started yesterday with only his right small finger and when he went to bed last night his symptoms were very mild.  Upon awakening this morning he had bilateral hand swelling and itching which is starting to move onto his wrists.  He has not had any medications for this.  He did try to take a "cortisone pill" yesterday but states it did not do very much.  He denies any other rashes or swelling of his entire body face lips tongue eyelids periorbital regions neck chest abdomen or any of his legs.  All of these areas seem normal.  He has no shortness of breath wheezing coughing or any difficulty breathing or speaking.  He denies any specific allergies in the past and denies any recent food exposures that he knows of, there is been no topical exposures or other environmental exposures.  History reviewed. No pertinent past medical history.  Patient Active Problem List   Diagnosis Date Noted  . Spinal stenosis of lumbar region 10/15/2012  . CLOSED FRACTURE OF LATERAL MALLEOLUS 05/19/2009  . ANKLE SPRAIN 05/19/2009    Past Surgical History:  Procedure Laterality Date  . HERNIA REPAIR         Home Medications    Prior to Admission medications   Medication Sig Start Date End Date Taking? Authorizing Provider  acetaminophen (TYLENOL) 500 MG tablet Take 1,500 mg by mouth 2 (two) times daily as needed for moderate pain.    [provider]  amoxicillin (AMOXIL) 500 MG capsule Take 1 capsule (500 mg total) by mouth 3  (three) times daily. 09/16/14   Ivery QualeBryant, Hobson, PA-C  doxycycline (VIBRAMYCIN) 100 MG capsule Take 1 capsule (100 mg total) by mouth 2 (two) times daily. 02/01/15   Eber HongMiller, Isatu Macinnes, MD  HYDROcodone-acetaminophen (NORCO/VICODIN) 5-325 MG per tablet Take 2 tablets by mouth every 4 (four) hours as needed. Patient not taking: Reported on 09/16/2014 07/07/14   Elson AreasSofia, Leslie K, PA-C  ibuprofen (ADVIL,MOTRIN) 800 MG tablet Take 1 tablet (800 mg total) by mouth 3 (three) times daily. 02/01/15   Eber HongMiller, Langston Summerfield, MD  omeprazole (PRILOSEC) 20 MG capsule Take 20 mg by mouth daily.    [provider]  predniSONE (DELTASONE) 20 MG tablet Take 2 tablets (40 mg total) by mouth daily. 01/11/17   Eber HongMiller, Wayne Brunker, MD    Family History No family history on file.  Social History Social History   Tobacco Use  . Smoking status: Current Every Day Smoker    Packs/day: 0.50    Years: 15.00    Pack years: 7.50    Types: Cigarettes  . Smokeless tobacco: Never Used  Substance Use Topics  . Alcohol use: Yes    Comment: occasionally  . Drug use: No     Allergies   Patient has no known allergies.   Review of Systems Review of Systems  All other systems reviewed and are negative.    Physical Exam Updated Vital Signs BP (!) 182/96 (BP Location: Right Arm)  Pulse 98   Temp 98.5 F (36.9 C) (Oral)   Resp 20   Ht 6\' 2"  (1.88 m)   Wt 120.2 kg (265 lb)   SpO2 99%   BMI 34.02 kg/m   Physical Exam  Constitutional: He appears well-developed and well-nourished. No distress.  HENT:  Head: Normocephalic and atraumatic.  Mouth/Throat: Oropharynx is clear and moist. No oropharyngeal exudate.  No swelling of the periorbital tissue, the lips appear normal, tongue is normal, phonation is normal and the posterior pharynx is widely patent  Eyes: Conjunctivae and EOM are normal. Pupils are equal, round, and reactive to light. Right eye exhibits no discharge. Left eye exhibits no discharge. No scleral icterus.    Neck: Normal range of motion. Neck supple. No JVD present. No thyromegaly present.  Cardiovascular: Normal rate, regular rhythm, normal heart sounds and intact distal pulses. Exam reveals no gallop and no friction rub.  No murmur heard. Pulmonary/Chest: Effort normal and breath sounds normal. No respiratory distress. He has no wheezes. He has no rales.  Abdominal: Soft. Bowel sounds are normal. He exhibits no distension and no mass. There is no tenderness.  Musculoskeletal: Normal range of motion. He exhibits no edema or tenderness.  The patient has mild edema to the bilateral hands with urticarial lesions across the palms wrists and the posterior dorsal aspect of the hands as well.  Lymphadenopathy:    He has no cervical adenopathy.  Neurological: He is alert. Coordination normal.  We will speech coordination and gait, normal strength in all 4 extremities  Skin: Skin is warm and dry. No rash noted. No erythema.  Urticaria lesions as above only on the wrists and hands  Psychiatric: He has a normal mood and affect. His behavior is normal.  Nursing note and vitals reviewed.    ED Treatments / Results  Labs (all labs ordered are listed, but only abnormal results are displayed) Labs Reviewed - No data to display   Radiology No results found.  Procedures Procedures (including critical care time)  Medications Ordered in ED Medications  predniSONE (DELTASONE) tablet 40 mg (not administered)     Initial Impression / Assessment and Plan / ED Course  I have reviewed the triage vital signs and the nursing notes.  Pertinent labs & imaging results that were available during my care of the patient were reviewed by me and considered in my medical decision making (see chart for details).     The patient is having an acute allergic reaction which seems to be localized to his hands.  He does not appear to be in distress, he is not tachycardic and has no signs of anaphylaxis.  He will be  prescribed prednisone daily for 5 days and encouraged to use Benadryl every 6 hours as needed.  He does not need epinephrine at this time but he was given the indications for return should his symptoms worsen and he expresses understanding.  I will also encouraged him to follow-up in the outpatient setting for allergy testing  Final Clinical Impressions(s) / ED Diagnoses   Final diagnoses:  Acute allergic reaction, initial encounter    ED Discharge Orders        Ordered    predniSONE (DELTASONE) 20 MG tablet  Daily     01/11/17 1301       Eber HongMiller, Mykai Wendorf, MD 01/11/17 1303

## 2017-01-11 NOTE — ED Triage Notes (Signed)
Patient reports of swelling/itching to both hands since x2 days. Denies any known cause.

## 2018-03-26 ENCOUNTER — Emergency Department (HOSPITAL_COMMUNITY)
Admission: EM | Admit: 2018-03-26 | Discharge: 2018-03-26 | Disposition: A | Discharge status: Home / Self Care | Payer: Managed Care, Other (non HMO) | Attending: Emergency Medicine | Admitting: Emergency Medicine

## 2018-03-26 ENCOUNTER — Emergency Department (HOSPITAL_COMMUNITY): Payer: Managed Care, Other (non HMO)

## 2018-03-26 ENCOUNTER — Other Ambulatory Visit: Payer: Self-pay

## 2018-03-26 ENCOUNTER — Encounter (HOSPITAL_COMMUNITY): Payer: Self-pay | Admitting: *Deleted

## 2018-03-26 DIAGNOSIS — R0781 Pleurodynia: Secondary | ICD-10-CM | POA: Insufficient documentation

## 2018-03-26 DIAGNOSIS — R0789 Other chest pain: Secondary | ICD-10-CM | POA: Diagnosis present

## 2018-03-26 DIAGNOSIS — F17210 Nicotine dependence, cigarettes, uncomplicated: Secondary | ICD-10-CM | POA: Diagnosis not present

## 2018-03-26 DIAGNOSIS — Z79899 Other long term (current) drug therapy: Secondary | ICD-10-CM | POA: Diagnosis not present

## 2018-03-26 DIAGNOSIS — R030 Elevated blood-pressure reading, without diagnosis of hypertension: Secondary | ICD-10-CM

## 2018-03-26 NOTE — Discharge Instructions (Addendum)
Alternate ice and heat to your chest wall.  Take ibuprofen 600-800 mg three times a day with food.  Avoid heavy lifting over bending.  Your blood pressure tonight is elevated.  You can contact one of the clinics listed to arrange follow-up.  Return to ER for any worsening symptoms such as coughing up or urinating blood and sudden increasing pain or shortness of breath.

## 2018-03-26 NOTE — ED Provider Notes (Signed)
[facility] EMERGENCY DEPARTMENT Provider Note   CSN: [id number] Arrival date & time: 03/26/18  2112    History   Chief Complaint Chief Complaint  Patient presents with  . Flank Pain    HPI Derrick Mcdonald is a 33 y.o. male.     HPI   Derrick Mcdonald is a 33 y.o. male who presents to the Emergency Department complaining of pain along the right lateral ribs that have been waxing and waning for several days.  Tonight, he states that he coughed really hard and felt a sharp pain radiate from his lateral right rib to his back.  Pain is associated with movement of the right arm, palpation, and deep breathing.  Pain improves at rest.  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 He denies abdominal pain, shortness of breath, vomiting, hemoptysis, hematuria and dysuria.  No history of kidney stones    History reviewed. No pertinent past medical history.  Patient Active Problem List   Diagnosis Date Noted  . Spinal stenosis of lumbar region 10/15/2012  . CLOSED FRACTURE OF LATERAL MALLEOLUS 05/19/2009  . ANKLE SPRAIN 05/19/2009    Past Surgical History:  Procedure Laterality Date  . HERNIA REPAIR        Home Medications    Prior to Admission medications   Medication Sig Start Date End Date Taking? Authorizing Provider  acetaminophen (TYLENOL) 500 MG tablet Take 1,500 mg by mouth 2 (two) times daily as needed for moderate pain.    [provider]  amoxicillin (AMOXIL) 500 MG capsule Take 1 capsule (500 mg total) by mouth 3 (three) times daily. 09/16/14   Ivery Quale, PA-C  doxycycline (VIBRAMYCIN) 100 MG capsule Take 1 capsule (100 mg total) by mouth 2 (two) times daily. 02/01/15   Eber Hong, MD  HYDROcodone-acetaminophen (NORCO/VICODIN) 5-325 MG per tablet Take 2 tablets by mouth every 4 (four) hours as needed. Patient not taking: Reported on 09/16/2014 07/07/14   Elson Areas, PA-C  ibuprofen (ADVIL,MOTRIN) 800 MG  tablet Take 1 tablet (800 mg total) by mouth 3 (three) times daily. 02/01/15   Eber Hong, MD  omeprazole (PRILOSEC) 20 MG capsule Take 20 mg by mouth daily.    [provider]  predniSONE (DELTASONE) 20 MG tablet Take 2 tablets (40 mg total) by mouth daily. 01/11/17   Eber Hong, MD    Family History History reviewed. No pertinent family history.  Social History Social History   Tobacco Use  . Smoking status: Current Every Day Smoker    Packs/day: 0.50    Years: 15.00    Pack years: 7.50    Types: Cigarettes  . Smokeless tobacco: Never Used  Substance Use Topics  . Alcohol use: Yes    Comment: occasionally  . Drug use: No     Allergies   Patient has no known allergies.   Review of Systems Review of Systems  Constitutional: Negative for chills and fever.  Respiratory: Negative for shortness of breath.   Cardiovascular: Positive for chest pain (Right rib pain).  Gastrointestinal: Negative for abdominal pain, nausea and vomiting.  Genitourinary: Negative for difficulty urinating, dysuria, frequency, hematuria, penile swelling, scrotal swelling and testicular pain.  Musculoskeletal: Negative for arthralgias, back pain and neck pain.  Skin: Negative for color change and wound.     Physical Exam Updated Vital Signs BP (!) 148/86 (BP Location: Right Arm)   Pulse (!) 104   Temp 98.5  F (36.9 C) (Oral)   Resp 15   Ht 6\' 2"  (1.88 m)   Wt 122.9 kg   SpO2 100%   BMI 34.79 kg/m   Physical Exam Vitals signs and nursing note reviewed.  Constitutional:      General: He is not in acute distress.    Appearance: Normal appearance.  HENT:     Head: Atraumatic.     Mouth/Throat:     Mouth: Mucous membranes are moist.     Pharynx: Oropharynx is clear.  Neck:     Musculoskeletal: Normal range of motion.  Cardiovascular:     Rate and Rhythm: Normal rate and regular rhythm.     Pulses: Normal pulses.  Pulmonary:     Effort: Pulmonary effort is normal.      Breath sounds: Normal breath sounds. No wheezing.     Comments: Focal tenderness to palpation of the right lateral ribs.  No crepitus or bony deformity. Chest:     Chest wall: Tenderness present.  Abdominal:     General: There is no distension.     Palpations: Abdomen is soft.     Tenderness: There is no abdominal tenderness. There is no guarding.  Musculoskeletal: Normal range of motion.  Skin:    General: Skin is warm.     Capillary Refill: Capillary refill takes less than 2 seconds.     Findings: No rash.  Neurological:     General: No focal deficit present.     Mental Status: He is alert.     Sensory: No sensory deficit.     Motor: No weakness.      ED Treatments / Results  Labs (all labs ordered are listed, but only abnormal results are displayed) Labs Reviewed - No data to display  EKG None  Radiology Dg Ribs Unilateral W/chest Right  Result Date: 03/26/2018 CLINICAL DATA:  33 y/o M; right-sided axillary lower rib pain for 2 days. Pain worsened after coughing. EXAM: RIGHT RIBS AND CHEST - 3+ VIEW COMPARISON:  None. FINDINGS: No fracture or other bone lesions are seen involving the ribs. There is no evidence of pneumothorax or pleural effusion. Both lungs are clear. Heart size and mediastinal contours are within normal limits. IMPRESSION: Negative. Electronically Signed   By: Mitzi Hansen M.D.   On: 03/26/2018 22:38    Procedures Procedures (including critical care time)  Medications Ordered in ED Medications - No data to display   Initial Impression / Assessment and Plan / ED Course  I have reviewed the triage vital signs and the nursing notes.  Pertinent labs & imaging results that were available during my care of the patient were reviewed by me and considered in my medical decision making (see chart for details).       Pt is well appearing.  Vitals reassuring.  Focal ttp of the lateral right ribs.  abd is soft and NT.  Pain is reproducible with  palpation and abduction of the right arm.  I feel symptoms are likely musculoskeletal, doubt PE.  No dysuria or hematuria to suggest kidney stone.  Of note, patient is hypertensive without known history.  I have advised him that he will need outpatient follow-up.  He verbalized understanding agrees to plan.  Strict return precautions also discussed.   Final Clinical Impressions(s) / ED Diagnoses   Final diagnoses:  Rib pain on right side  Elevated blood pressure reading    ED Discharge Orders    None  Pauline Aus, PA-C 03/26/18 2336    Bethann Berkshire, MD 03/26/18 [phone]

## 2018-03-26 NOTE — ED Triage Notes (Signed)
Pt c/o right rib pain; pt states he coughed really hard last week and has been having increased pain since then; pt states he has pain with movement

## 2018-11-04 ENCOUNTER — Other Ambulatory Visit: Payer: Self-pay

## 2018-11-04 ENCOUNTER — Other Ambulatory Visit: Payer: Self-pay | Admitting: Family Medicine

## 2018-11-04 ENCOUNTER — Ambulatory Visit: Payer: Managed Care, Other (non HMO)

## 2018-11-04 DIAGNOSIS — M25511 Pain in right shoulder: Secondary | ICD-10-CM

## 2018-11-22 ENCOUNTER — Other Ambulatory Visit: Payer: Self-pay

## 2018-11-22 DIAGNOSIS — Z20822 Contact with and (suspected) exposure to covid-19: Secondary | ICD-10-CM

## 2018-11-23 LAB — NOVEL CORONAVIRUS, NAA: SARS-CoV-2, NAA: NOT DETECTED

## 2021-02-14 IMAGING — DX RIGHT RIBS AND CHEST - 3+ VIEW
5 series · 5 of 5 positions shown · non-contrast
Comparison: None.

CLINICAL DATA: 33 y/o M; right-sided axillary lower rib pain for 2
days. Pain worsened after coughing.

EXAM:
RIGHT RIBS AND CHEST - 3+ VIEW

[chest pa]
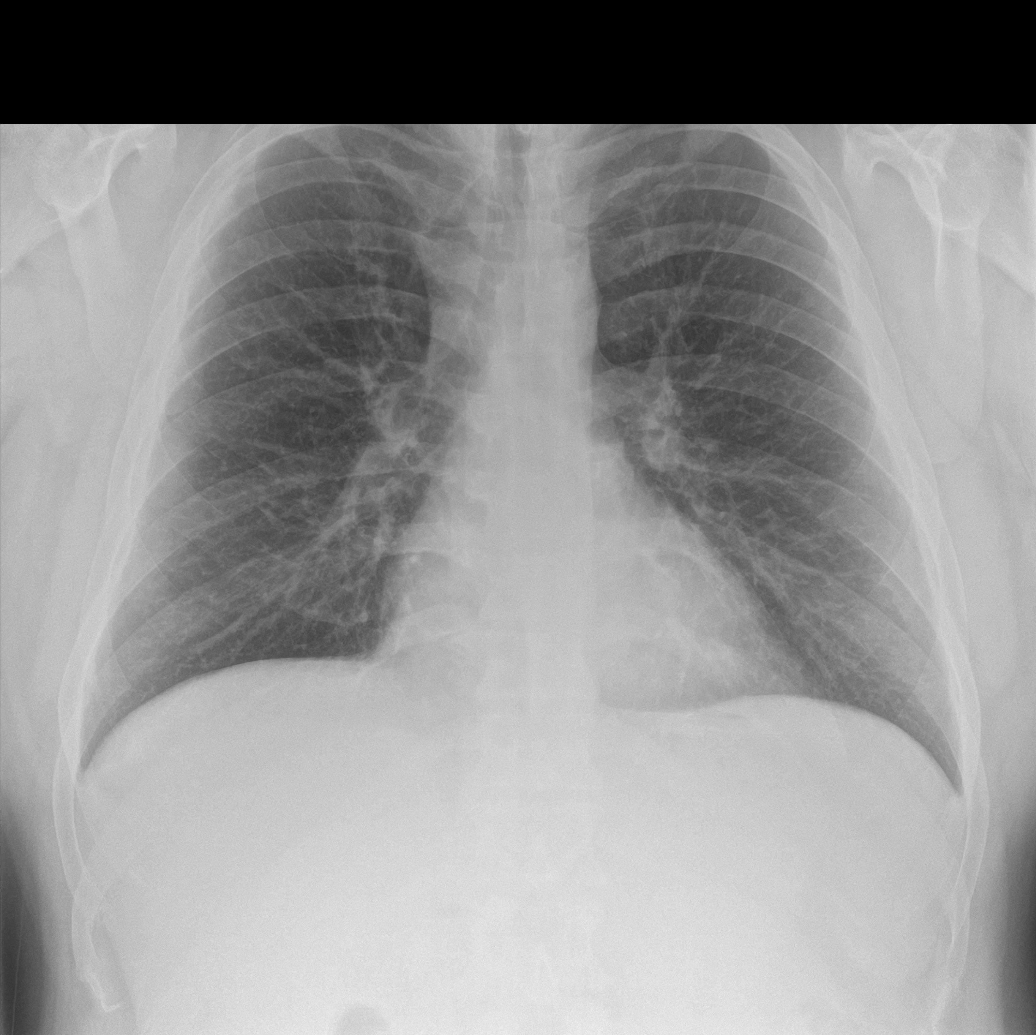

[rib pa (1 of 2)]
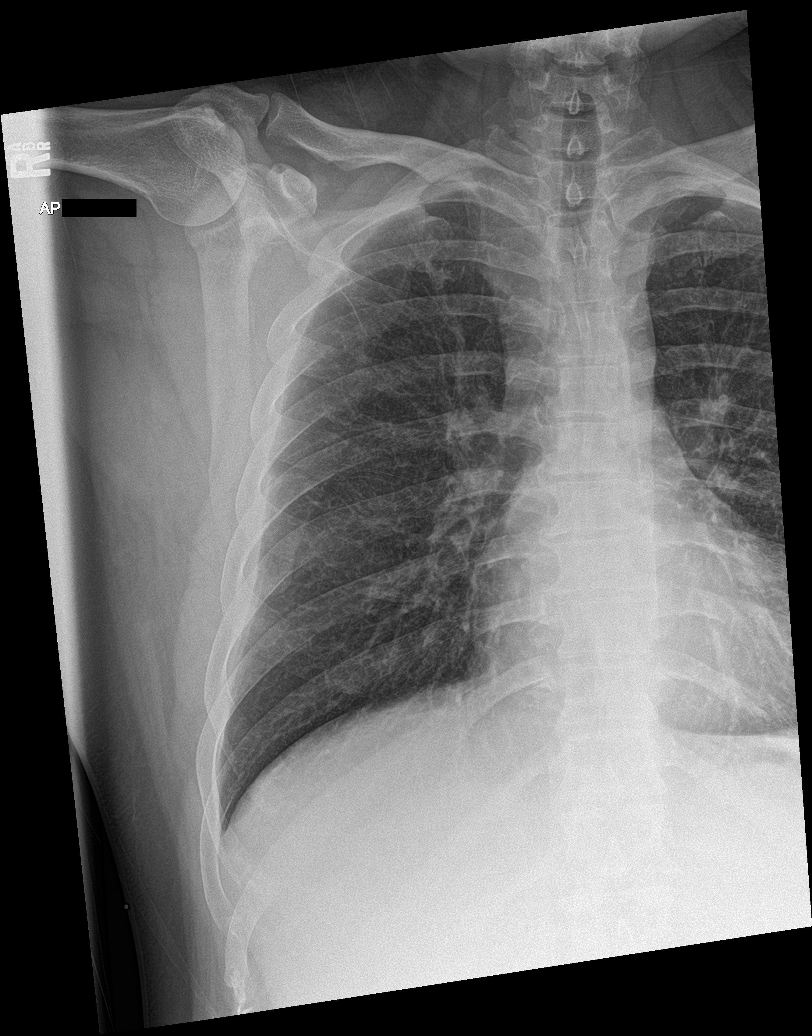

[rib pa obl (1 of 2)]
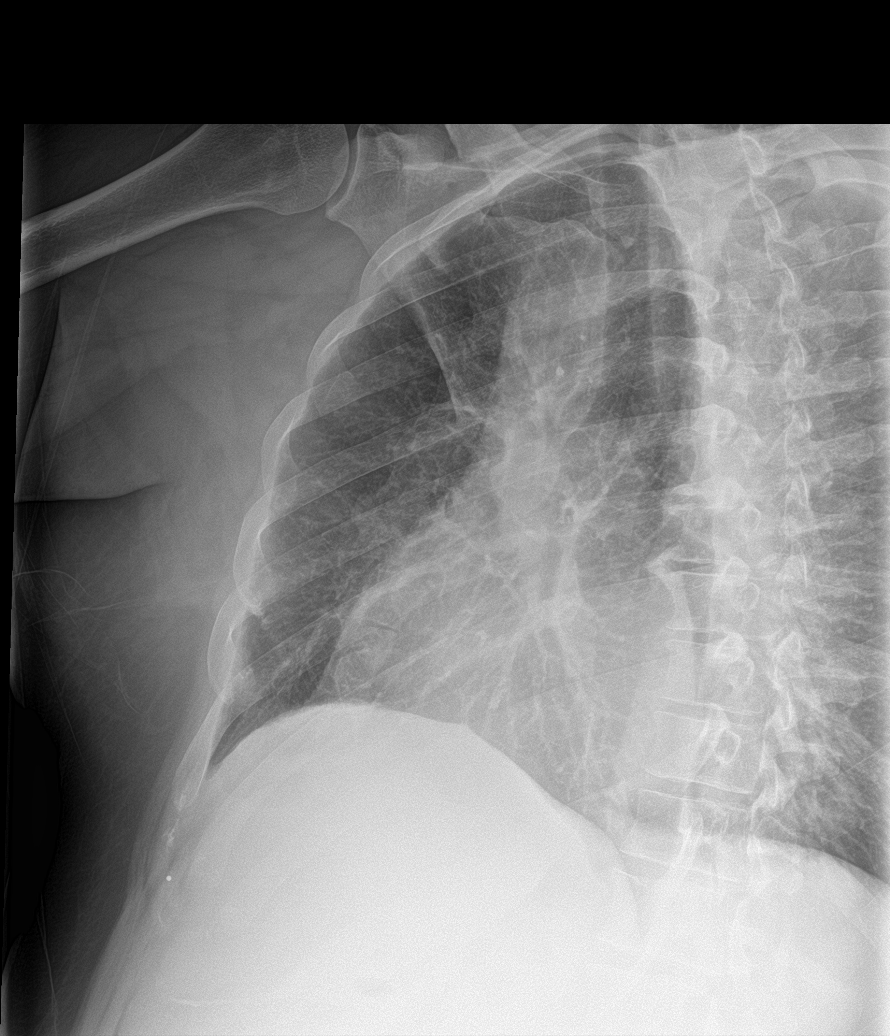

[rib pa (2 of 2)]
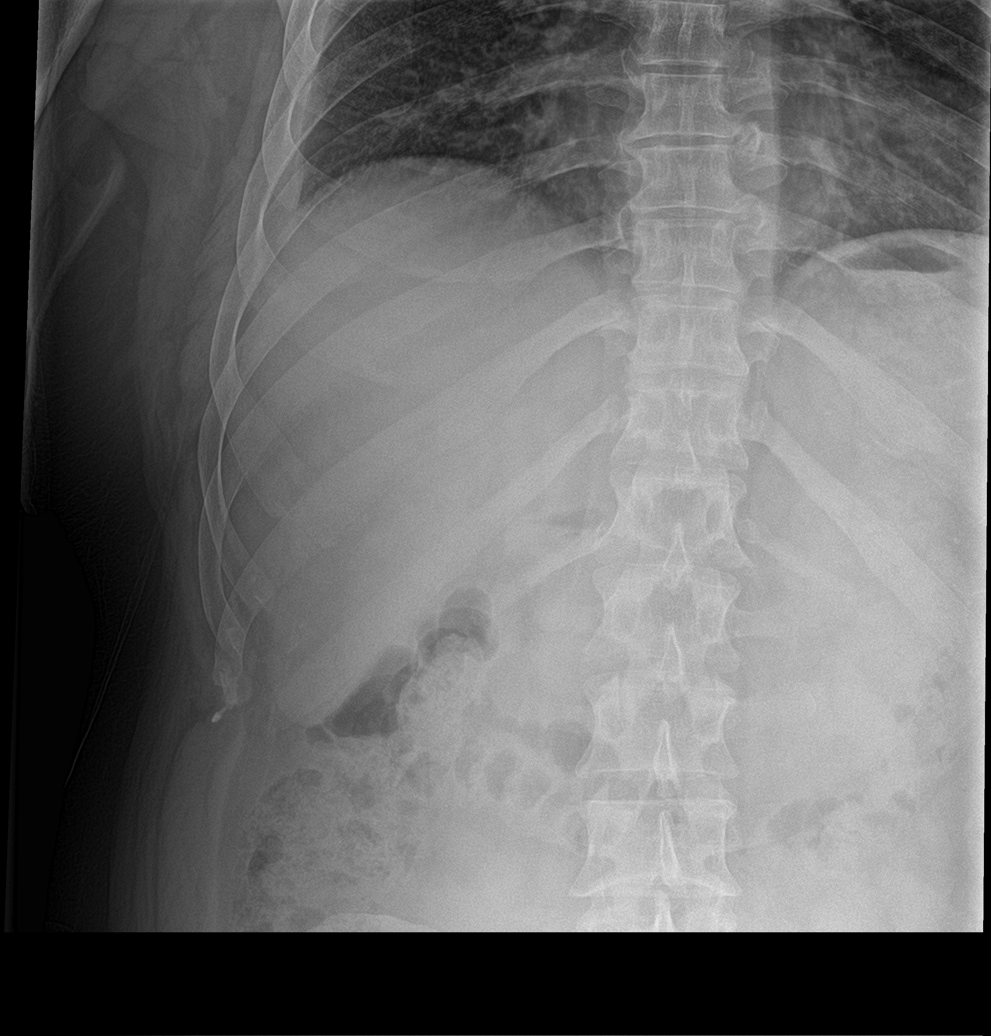

[rib pa obl (2 of 2)]
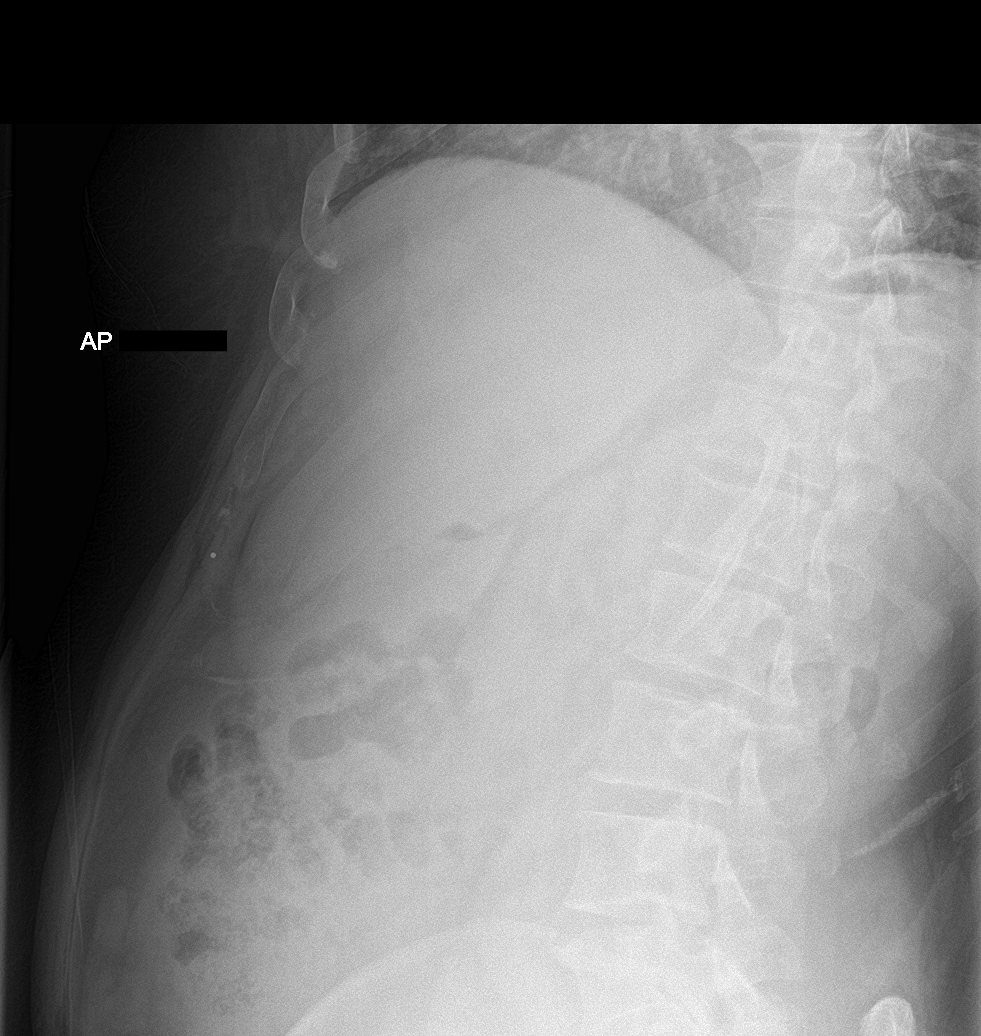

[5 of 5 positions shown; findings below may reference images not displayed]

FINDINGS: No fracture or other bone lesions are seen involving the ribs. There
is no evidence of pneumothorax or pleural effusion. Both lungs are
clear. Heart size and mediastinal contours are within normal limits.
IMPRESSION: Negative.

## 2021-09-25 IMAGING — DX DG SHOULDER 2+V*R*
3 series · 3 of 3 positions shown · non-contrast
Comparison: None.

CLINICAL DATA: Right shoulder pain following heavy lifting, initial
encounter

EXAM:
RIGHT SHOULDER - 2+ VIEW

[shoulder ap]
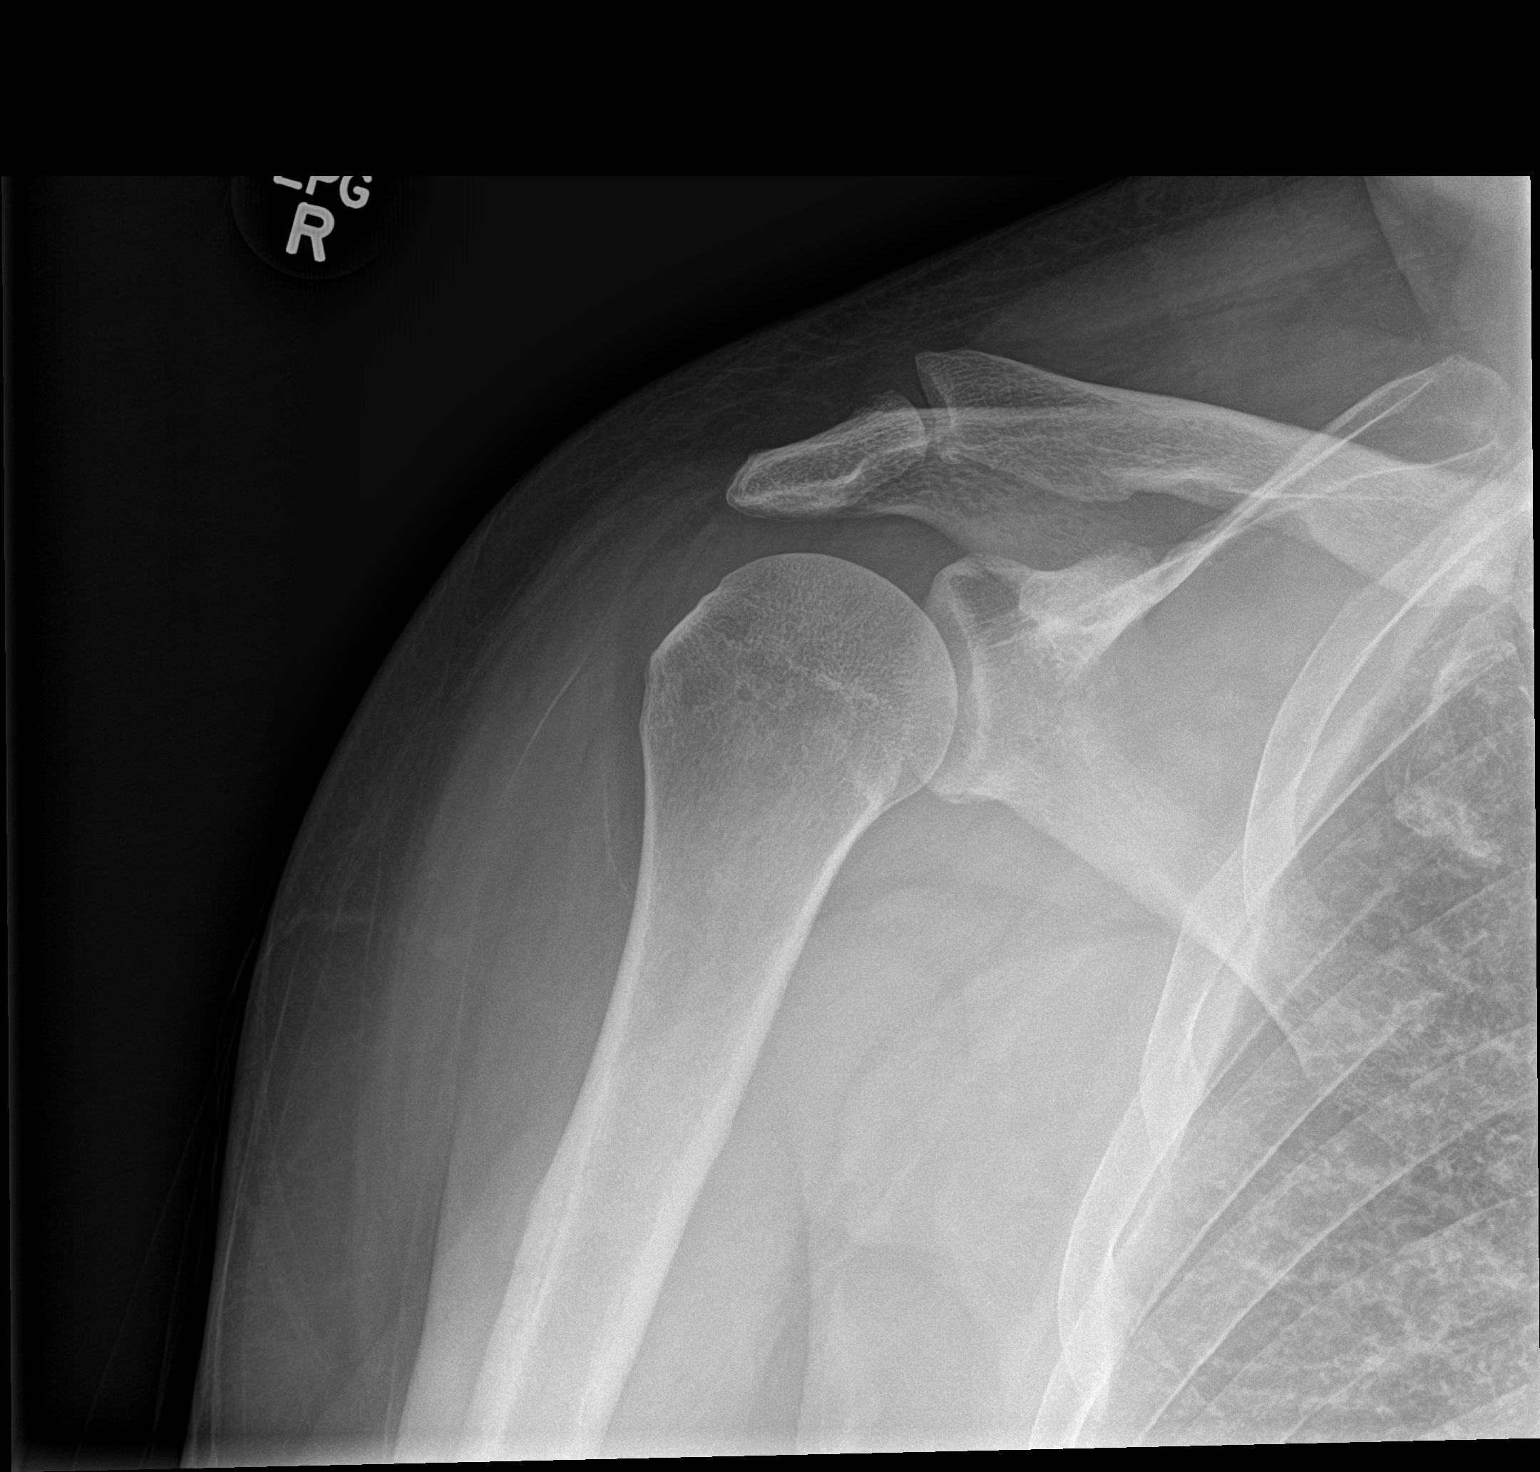

[shoulder y-view]
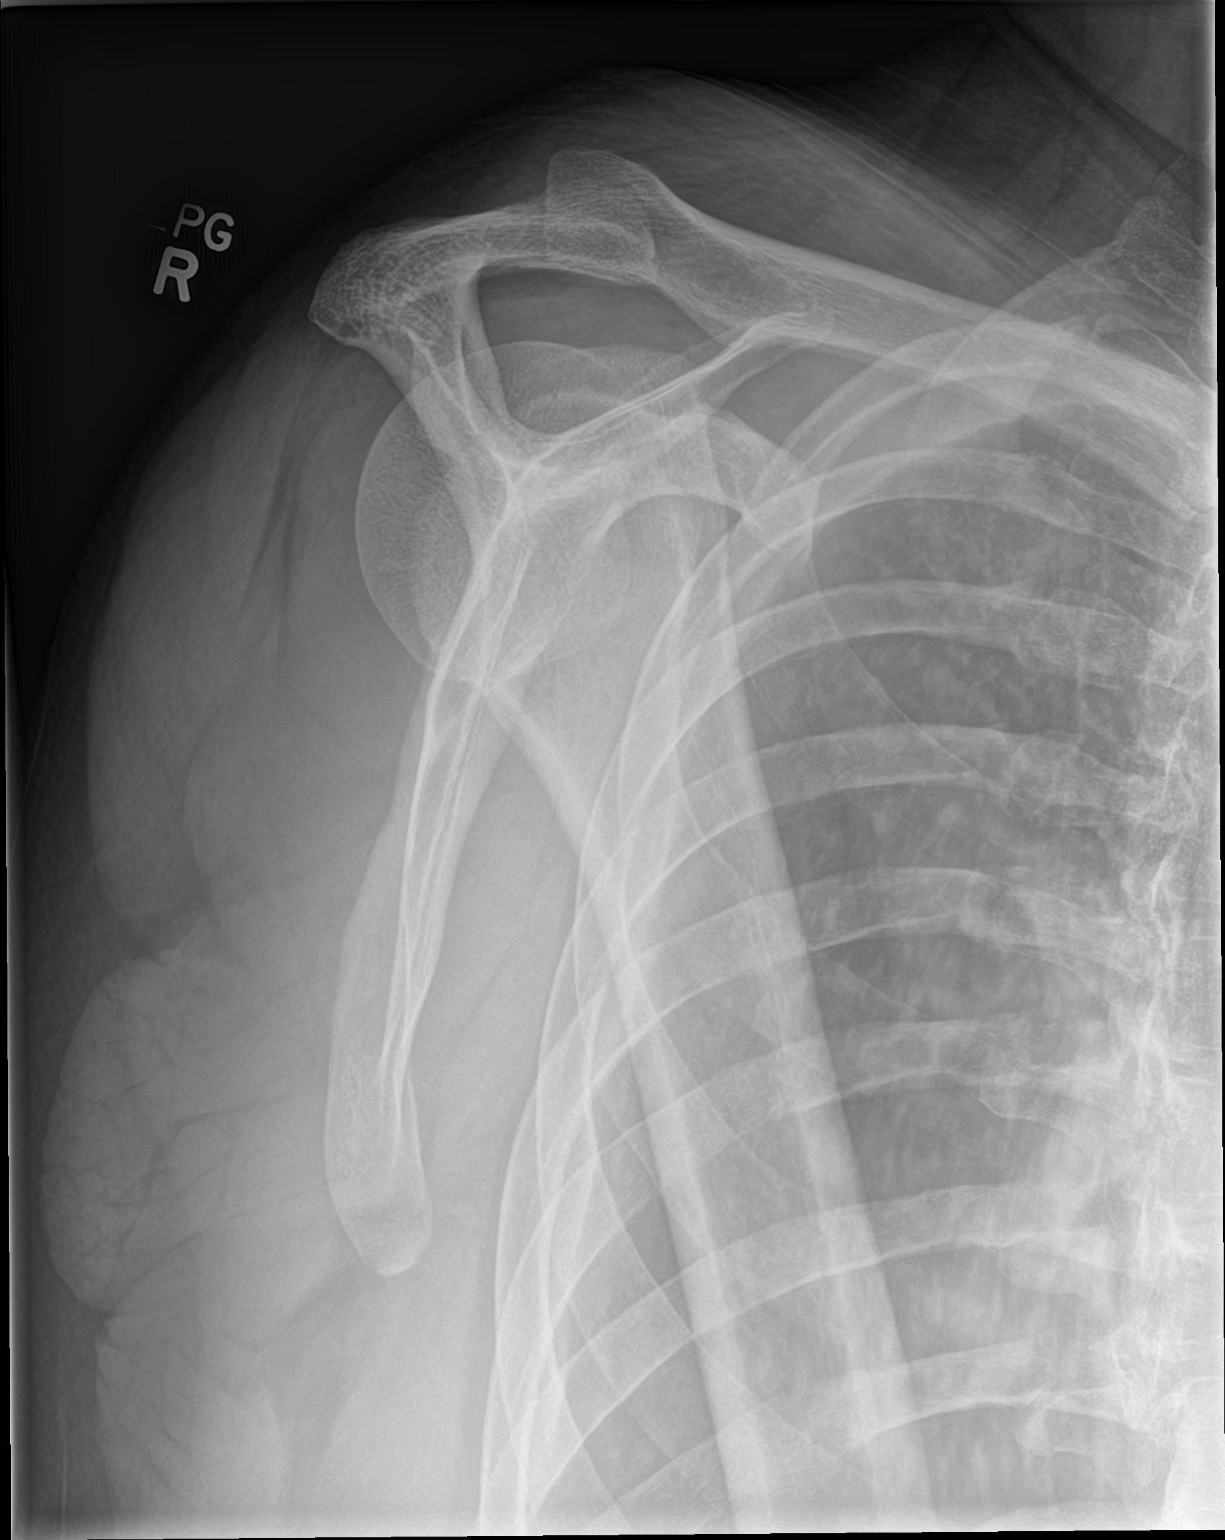

[shoulder axial]
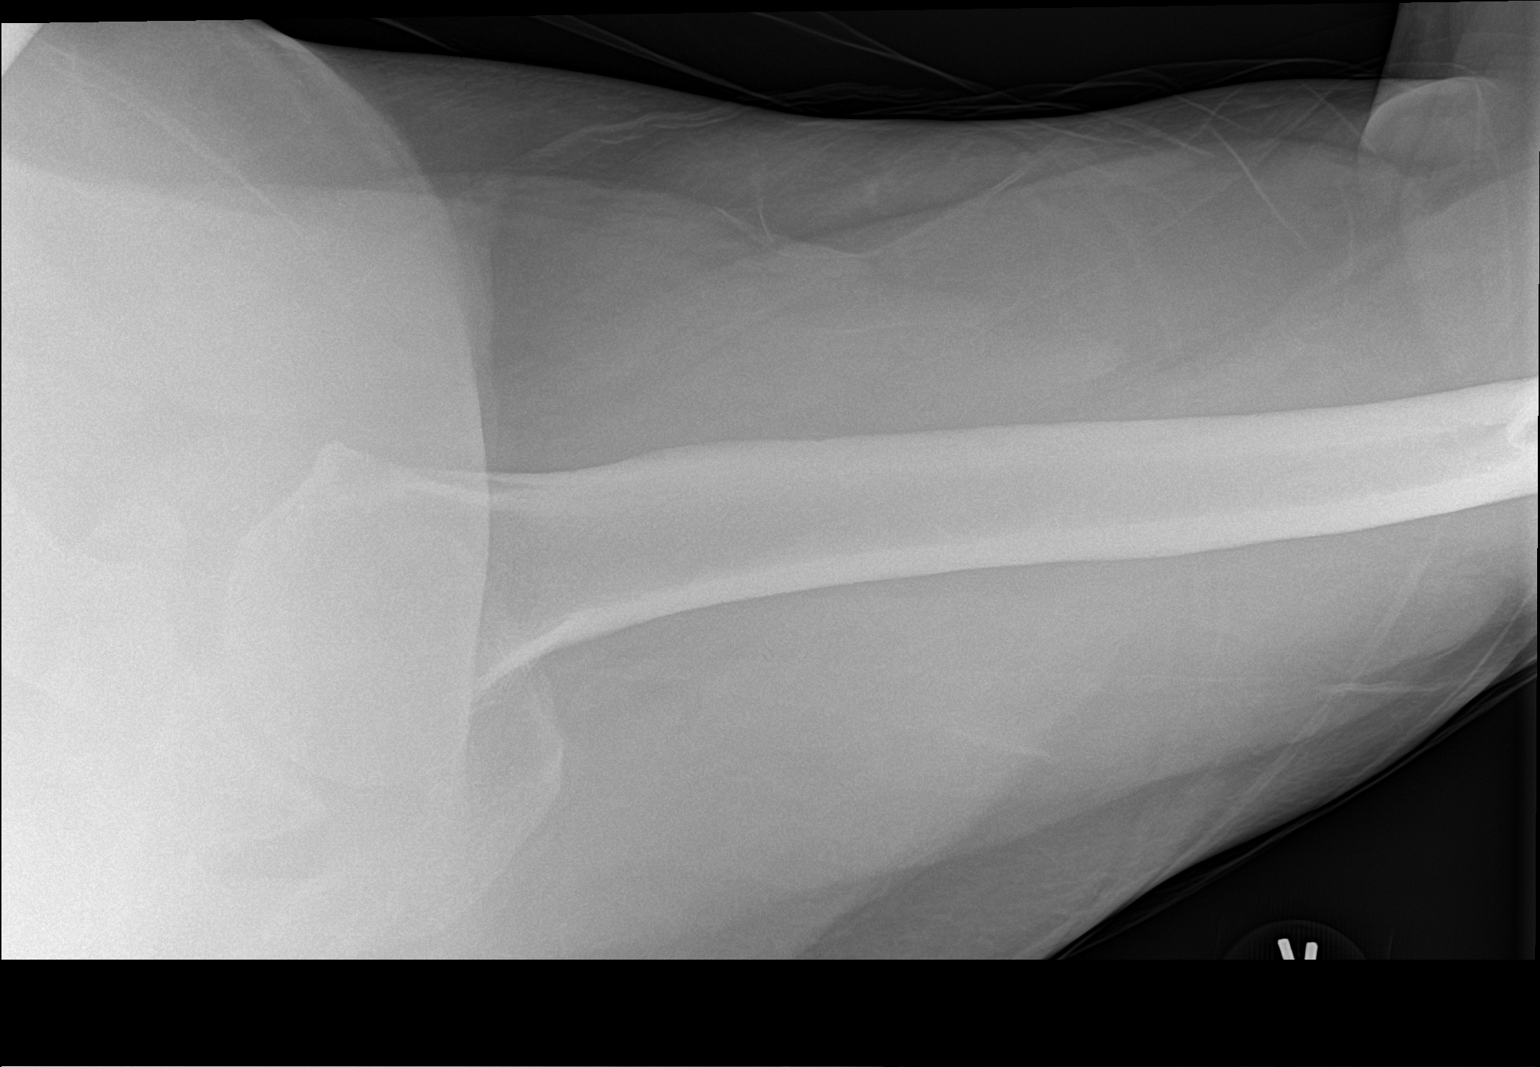

[3 of 3 positions shown; findings below may reference images not displayed]

FINDINGS: Mild degenerative changes of the acromioclavicular joint are seen.
No acute fracture or dislocation is noted. Underlying bony thorax is
within normal limits. No soft tissue abnormality is seen.
IMPRESSION: Mild degenerative changes of the acromioclavicular joint without
acute abnormality.
